# Patient Record
Sex: Female | Born: 1970
Health system: Southern US, Community
[De-identification: ages and names within clinical notes are randomized; demographics above are authoritative.]

---

## 2014-10-03 ENCOUNTER — Other Ambulatory Visit (HOSPITAL_COMMUNITY)
Admission: RE | Admit: 2014-10-03 | Discharge: 2014-10-03 | Disposition: A | Discharge status: Home / Self Care | Payer: 59 | Source: Ambulatory Visit | Attending: Family Medicine | Admitting: Family Medicine

## 2014-10-03 ENCOUNTER — Other Ambulatory Visit: Payer: Self-pay | Admitting: Family Medicine

## 2014-10-03 DIAGNOSIS — Z1231 Encounter for screening mammogram for malignant neoplasm of breast: Secondary | ICD-10-CM

## 2014-10-03 DIAGNOSIS — Z01419 Encounter for gynecological examination (general) (routine) without abnormal findings: Secondary | ICD-10-CM | POA: Insufficient documentation

## 2014-10-07 LAB — CYTOLOGY - PAP

## 2015-11-28 DIAGNOSIS — H5203 Hypermetropia, bilateral: Secondary | ICD-10-CM | POA: Diagnosis not present

## 2015-11-28 DIAGNOSIS — H04123 Dry eye syndrome of bilateral lacrimal glands: Secondary | ICD-10-CM | POA: Diagnosis not present

## 2015-11-28 DIAGNOSIS — H524 Presbyopia: Secondary | ICD-10-CM | POA: Diagnosis not present

## 2015-12-12 ENCOUNTER — Ambulatory Visit
Admission: RE | Admit: 2015-12-12 | Discharge: 2015-12-12 | Disposition: A | Discharge status: Home / Self Care | Payer: 59 | Source: Ambulatory Visit | Attending: Family Medicine | Admitting: Family Medicine

## 2015-12-12 ENCOUNTER — Other Ambulatory Visit: Payer: Self-pay | Admitting: Family Medicine

## 2015-12-12 DIAGNOSIS — M19049 Primary osteoarthritis, unspecified hand: Secondary | ICD-10-CM | POA: Diagnosis not present

## 2015-12-12 DIAGNOSIS — M79641 Pain in right hand: Secondary | ICD-10-CM

## 2015-12-12 DIAGNOSIS — M79642 Pain in left hand: Secondary | ICD-10-CM

## 2015-12-12 DIAGNOSIS — M19041 Primary osteoarthritis, right hand: Secondary | ICD-10-CM | POA: Diagnosis not present

## 2015-12-26 DIAGNOSIS — M25532 Pain in left wrist: Secondary | ICD-10-CM | POA: Diagnosis not present

## 2015-12-26 DIAGNOSIS — S63502A Unspecified sprain of left wrist, initial encounter: Secondary | ICD-10-CM | POA: Diagnosis not present

## 2016-01-08 DIAGNOSIS — M79642 Pain in left hand: Secondary | ICD-10-CM | POA: Diagnosis not present

## 2016-01-08 DIAGNOSIS — M2550 Pain in unspecified joint: Secondary | ICD-10-CM | POA: Diagnosis not present

## 2016-01-08 DIAGNOSIS — M79641 Pain in right hand: Secondary | ICD-10-CM | POA: Diagnosis not present

## 2016-01-09 DIAGNOSIS — S63502D Unspecified sprain of left wrist, subsequent encounter: Secondary | ICD-10-CM | POA: Diagnosis not present

## 2016-09-01 DIAGNOSIS — H5201 Hypermetropia, right eye: Secondary | ICD-10-CM | POA: Diagnosis not present

## 2017-01-06 DIAGNOSIS — H5203 Hypermetropia, bilateral: Secondary | ICD-10-CM | POA: Diagnosis not present

## 2018-01-09 DIAGNOSIS — H1033 Unspecified acute conjunctivitis, bilateral: Secondary | ICD-10-CM | POA: Diagnosis not present

## 2018-01-12 ENCOUNTER — Telehealth: Payer: Self-pay | Admitting: General Practice

## 2018-01-12 NOTE — Telephone Encounter (Signed)
Patient has set a [facility] request to become a patient of yours.   Please advise. Thank you.

## 2018-01-13 NOTE — Telephone Encounter (Signed)
Fine

## 2018-01-13 NOTE — Telephone Encounter (Signed)
LVM to inform patient Dr.Crawford would accept her as a New patient, to please call the office back and set that appointment up.   Office type would be OV and office note would be NEW PATIENT - We will have manger changer office type at a later date.

## 2018-01-16 NOTE — Telephone Encounter (Signed)
LVM again information patient to call and set up an appointment.

## 2018-02-17 DIAGNOSIS — H524 Presbyopia: Secondary | ICD-10-CM | POA: Diagnosis not present

## 2018-02-17 DIAGNOSIS — H5203 Hypermetropia, bilateral: Secondary | ICD-10-CM | POA: Diagnosis not present

## 2018-07-26 DIAGNOSIS — K1379 Other lesions of oral mucosa: Secondary | ICD-10-CM | POA: Diagnosis not present

## 2018-07-26 DIAGNOSIS — H9202 Otalgia, left ear: Secondary | ICD-10-CM | POA: Diagnosis not present

## 2018-07-26 MED FILL — AMOX-CLAV 875-125 MG TABLET: 875-125 | 7 days supply | Qty: 14 | Fill #0

## 2018-07-26 MED FILL — CHLORHEXIDINE 0.12% RINSE: 0.12 | 15 days supply | Qty: 473 | Fill #0

## 2018-07-26 MED FILL — MELOXICAM 15 MG TABLET: 15 | 90 days supply | Qty: 90 | Fill #0

## 2018-07-26 MED FILL — predniSONE 10 MG (21) TBPK: 10 | 6 days supply | Qty: 21 | Fill #0

## 2018-08-08 DIAGNOSIS — R5383 Other fatigue: Secondary | ICD-10-CM | POA: Diagnosis not present

## 2018-08-08 DIAGNOSIS — R0602 Shortness of breath: Secondary | ICD-10-CM | POA: Diagnosis not present

## 2018-08-08 DIAGNOSIS — Z01419 Encounter for gynecological examination (general) (routine) without abnormal findings: Secondary | ICD-10-CM | POA: Diagnosis not present

## 2018-08-08 DIAGNOSIS — Z114 Encounter for screening for human immunodeficiency virus [HIV]: Secondary | ICD-10-CM | POA: Diagnosis not present

## 2018-08-08 DIAGNOSIS — Z0000 Encounter for general adult medical examination without abnormal findings: Secondary | ICD-10-CM | POA: Diagnosis not present

## 2018-08-08 DIAGNOSIS — E559 Vitamin D deficiency, unspecified: Secondary | ICD-10-CM | POA: Diagnosis not present

## 2018-08-08 LAB — PULMONARY FUNCTION TEST
FEV1/FVC: 48.6 %
FEV1/FVC: 48.6 %
FEV1: 1.5 L
FEV1: 1.5 L
FVC: 3 L
FVC: 3 L

## 2018-09-11 DIAGNOSIS — R87810 Cervical high risk human papillomavirus (HPV) DNA test positive: Secondary | ICD-10-CM | POA: Diagnosis not present

## 2019-04-16 DIAGNOSIS — H5203 Hypermetropia, bilateral: Secondary | ICD-10-CM | POA: Diagnosis not present

## 2019-04-16 DIAGNOSIS — H04123 Dry eye syndrome of bilateral lacrimal glands: Secondary | ICD-10-CM | POA: Diagnosis not present

## 2019-04-16 DIAGNOSIS — H524 Presbyopia: Secondary | ICD-10-CM | POA: Diagnosis not present

## 2020-01-07 ENCOUNTER — Ambulatory Visit: Payer: Medicaid Other | Admitting: Family Medicine

## 2020-10-01 ENCOUNTER — Other Ambulatory Visit: Payer: Self-pay | Admitting: Adult Medicine

## 2020-10-01 DIAGNOSIS — Z0000 Encounter for general adult medical examination without abnormal findings: Secondary | ICD-10-CM

## 2020-10-09 ENCOUNTER — Telehealth: Payer: Self-pay | Admitting: Hematology and Oncology

## 2020-10-09 NOTE — Telephone Encounter (Signed)
Received a new hem referral from [facility] for neutropenia. Ms. Backer returned my call and has been scheduled to see Dr. Leonides Schanz on  2/9 at 1pm. Pt aware to arrive 20 minutes early.

## 2020-10-13 LAB — HM COLONOSCOPY

## 2020-10-15 ENCOUNTER — Encounter: Payer: Self-pay | Admitting: Hematology and Oncology

## 2020-10-15 ENCOUNTER — Inpatient Hospital Stay: Payer: Medicaid Other

## 2020-10-15 ENCOUNTER — Other Ambulatory Visit: Payer: Self-pay

## 2020-10-15 ENCOUNTER — Inpatient Hospital Stay: Payer: Medicaid Other | Attending: Hematology and Oncology | Admitting: Hematology and Oncology

## 2020-10-15 VITALS — BP 137/88 | HR 82 | Temp 97.0°F | Resp 16 | Ht 65.0 in | Wt 151.1 lb

## 2020-10-15 DIAGNOSIS — D700 Congenital agranulocytosis: Secondary | ICD-10-CM

## 2020-10-15 DIAGNOSIS — D72819 Decreased white blood cell count, unspecified: Secondary | ICD-10-CM | POA: Diagnosis present

## 2020-10-15 DIAGNOSIS — D709 Neutropenia, unspecified: Secondary | ICD-10-CM | POA: Insufficient documentation

## 2020-10-15 LAB — CBC WITH DIFFERENTIAL (CANCER CENTER ONLY)
Abs Immature Granulocytes: 0 10*3/uL (ref 0.00–0.07)
Basophils Absolute: 0 10*3/uL (ref 0.0–0.1)
Basophils Relative: 1 %
Eosinophils Absolute: 0.1 10*3/uL (ref 0.0–0.5)
Eosinophils Relative: 3 %
HCT: 34.7 % — ABNORMAL LOW (ref 36.0–46.0)
Hemoglobin: 11 g/dL — ABNORMAL LOW (ref 12.0–15.0)
Immature Granulocytes: 0 %
Lymphocytes Relative: 46 %
Lymphs Abs: 1.3 10*3/uL (ref 0.7–4.0)
MCH: 26.4 pg (ref 26.0–34.0)
MCHC: 31.7 g/dL (ref 30.0–36.0)
MCV: 83.4 fL (ref 80.0–100.0)
Monocytes Absolute: 0.3 10*3/uL (ref 0.1–1.0)
Monocytes Relative: 12 %
Neutro Abs: 1.1 10*3/uL — ABNORMAL LOW (ref 1.7–7.7)
Neutrophils Relative %: 38 %
Platelet Count: 144 10*3/uL — ABNORMAL LOW (ref 150–400)
RBC: 4.16 MIL/uL (ref 3.87–5.11)
RDW: 12.9 % (ref 11.5–15.5)
WBC Count: 2.8 10*3/uL — ABNORMAL LOW (ref 4.0–10.5)
nRBC: 0 % (ref 0.0–0.2)

## 2020-10-15 LAB — CMP (CANCER CENTER ONLY)
ALT: 21 U/L (ref 0–44)
AST: 23 U/L (ref 15–41)
Albumin: 4.3 g/dL (ref 3.5–5.0)
Alkaline Phosphatase: 48 U/L (ref 38–126)
Anion gap: 7 (ref 5–15)
BUN: 12 mg/dL (ref 6–20)
CO2: 28 mmol/L (ref 22–32)
Calcium: 9.6 mg/dL (ref 8.9–10.3)
Chloride: 109 mmol/L (ref 98–111)
Creatinine: 0.85 mg/dL (ref 0.44–1.00)
GFR, Estimated: >60 mL/min (ref >60)
Glucose, Bld: 101 mg/dL — ABNORMAL HIGH (ref 70–99)
Potassium: 3.9 mmol/L (ref 3.5–5.1)
Sodium: 144 mmol/L (ref 135–145)
Total Bilirubin: 0.4 mg/dL (ref 0.3–1.2)
Total Protein: 7.6 g/dL (ref 6.5–8.1)

## 2020-10-15 LAB — SEDIMENTATION RATE: Sed Rate: 23 mm/hr — ABNORMAL HIGH (ref 0–22)

## 2020-10-15 LAB — VITAMIN B12: Vitamin B-12: 537 pg/mL (ref 180–914)

## 2020-10-15 LAB — HEPATITIS B CORE ANTIBODY, TOTAL: Hep B Core Total Ab: NONREACTIVE

## 2020-10-15 LAB — HEPATITIS C ANTIBODY: HCV Ab: NONREACTIVE

## 2020-10-15 LAB — HIV ANTIBODY (ROUTINE TESTING W REFLEX): HIV Screen 4th Generation wRfx: NONREACTIVE

## 2020-10-15 LAB — HEPATITIS B SURFACE ANTIGEN: Hepatitis B Surface Ag: NONREACTIVE

## 2020-10-15 LAB — FOLATE: Folate: 17.5 ng/mL (ref >5.9)

## 2020-10-15 LAB — SAVE SMEAR(SSMR), FOR PROVIDER SLIDE REVIEW

## 2020-10-15 NOTE — Progress Notes (Signed)
[place] Telephone:([phone]   [phone]  INITIAL CONSULT NOTE  Patient Care Team: Patient, No Pcp Per as PCP - General (General Practice)  Hematological/Oncological History # Leukopenia/Neutropenia 08/22/2020: WBC 3.5, Hgb 11.7, MCV 83, Plt 149. ANC 1.2, ALC 1.7 10/15/2020: establish care with Dr. Lorenso Courier   CHIEF COMPLAINTS/PURPOSE OF CONSULTATION:  "Neutropenia "  HISTORY OF PRESENTING ILLNESS:  Carolyn Henry 50 y.o. female with no remarkable medical history who presents for evaluation of leukopenia.   On review of the previous records Carolyn Henry had a CBC drawn on 08/22/2020 which showed a white blood cell count 3.5, hemoglobin 9.7, MCV of 83, platelets of 149, ANC of 1.2, with an ALC of 1.7.  There was noted to be a relative lymphocytosis given the moderately low ANC.  Due to concern for the patient's leukopenia the patient was referred to hematology for further evaluation and management.  On exam today Carolyn Henry notes that she has been aware of her low white blood cell count for less than a year.  She notes that she did not regularly see a primary care provider until recently when she established with his new provider.  She has never been told that she had trouble with her blood before.  She has never had any issues with anemia.  She currently denies any issues with recurrent infection such as urinary tract infections, diarrhea, or sinus infections.  She denies any fevers, chills, sweats, nausea, vomiting or diarrhea.  On further discussion the patient notes that she immigrated to [place] in the year 2000 from [place].  She moved [place] in 2020.  She reports that she has an unrestricted diet and eats everything including beef, chicken, pork, fish.  She used to take a multivitamin, but now only takes vitamin D.  Her family history is remarkable only for glaucoma in her father but no other health problems in the family other than  seizures in her son.  She is a never smoker and does not drink.  She had no additional questions comments or concerns today.  A full 10 point ROS is listed below.  MEDICAL HISTORY:  History reviewed. No pertinent past medical history.  SURGICAL HISTORY: Past Surgical History:  Procedure Laterality Date  . CESAREAN SECTION  2004    SOCIAL HISTORY: Social History   Socioeconomic History  . Marital status: Married    Spouse name: Not on file  . Number of children: 2  . Years of education: Not on file  . Highest education level: Not on file  Occupational History  . Not on file  Tobacco Use  . Smoking status: Never Smoker  . Smokeless tobacco: Never Used  Vaping Use  . Vaping Use: Never used  Substance and Sexual Activity  . Alcohol use: Never  . Drug use: Never  . Sexual activity: Not on file  Other Topics Concern  . Not on file  Social History Narrative  . Not on file   Social Determinants of Health   Financial Resource Strain: Not on file  Food Insecurity: Not on file  Transportation Needs: Not on file  Physical Activity: Not on file  Stress: Not on file  Social Connections: Not on file  Intimate Partner Violence: Not on file    FAMILY HISTORY: Family History  Problem Relation Age of Onset  . Healthy Mother   . Glaucoma Father   . Healthy Sister   . Healthy Brother     ALLERGIES:  has  no allergies on file.  MEDICATIONS:  Current Outpatient Medications  Medication Sig Dispense Refill  . VITAMIN D, ERGOCALCIFEROL, PO Take 500 mg PE by mouth daily.     No current facility-administered medications for this visit.    REVIEW OF SYSTEMS:   Constitutional: ( - ) fevers, ( - )  chills , ( - ) night sweats Eyes: ( - ) blurriness of vision, ( - ) double vision, ( - ) watery eyes Ears, nose, mouth, throat, and face: ( - ) mucositis, ( - ) sore throat Respiratory: ( - ) cough, ( - ) dyspnea, ( - ) wheezes Cardiovascular: ( - ) palpitation, ( - ) chest  discomfort, ( - ) lower extremity swelling Gastrointestinal:  ( - ) nausea, ( - ) heartburn, ( - ) change in bowel habits Skin: ( - ) abnormal skin rashes Lymphatics: ( - ) new lymphadenopathy, ( - ) easy bruising Neurological: ( - ) numbness, ( - ) tingling, ( - ) new weaknesses Behavioral/Psych: ( - ) mood change, ( - ) new changes  All other systems were reviewed with the patient and are negative.  PHYSICAL EXAMINATION:  Vitals:   10/15/20 1300  BP: 137/88  Pulse: 82  Resp: 16  Temp: (!) 97 F (36.1 C)  SpO2: 100%   Filed Weights   10/15/20 1300  Weight: 151 lb 1.6 oz (68.5 kg)    GENERAL: well appearing middle aged [place] American female in NAD  SKIN: skin color, texture, turgor are normal, no rashes or significant lesions EYES: conjunctiva are pink and non-injected, sclera clear LUNGS: clear to auscultation and percussion with normal breathing effort HEART: regular rate & rhythm and no murmurs and no lower extremity edema Musculoskeletal: no cyanosis of digits and no clubbing  PSYCH: alert & oriented x 3, fluent speech NEURO: no focal motor/sensory deficits  LABORATORY DATA:  I have reviewed the data as listed No flowsheet data found.  No flowsheet data found.   RADIOGRAPHIC STUDIES: No results found.  ASSESSMENT & PLAN Carolyn Henry 50 y.o. female with no remarkable medical history who presents for evaluation of leukopenia.   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  This is a common condition whereby individuals of African or Mediterranean descent tend to have slightly lower white blood cell counts in the general population.  Hallmark of the syndrome is an Early between 1000 and 1500 which is patient falls into.  This is of little to no clinical consequence.  Unfortunate this is a diagnosis of exclusion and therefore we will do further work-up in order to assure there  is no other etiology.  The differential for neutropenia is benign ethnic neutropenia, infectious etiology, nutritional etiology, inflammatory etiology, or medication.  The patient is not currently taking any medications know to cause neutropenia.  She also does not have any other signs or symptoms of nutritional deficiency, however we will check nutritional levels.  We will order viral studies with hep B, hep C, and HIV as well.  Assuming none of the studies show any concerning abnormalities we will see the patient back in 6 months time in order to continue to monitor.  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 --Rule out infectious etiology with hepatitis B serologies, hepatitis C serologies, and HIV. --Nutritional evaluation with vitamin B12 and folate. --Repeat CBC and CMP.  Additionally  we will order ESR and a peripheral blood film. --Assuming there are no concerning findings on her blood work-up today we will plan to see the patient back in approximately 6 months time.  Orders Placed This Encounter  Procedures  . CBC with Differential (Cancer Center Only)    Standing Status:   Future    Number of Occurrences:   1    Standing Expiration Date:   10/15/2021  . Save Smear (SSMR)    Standing Status:   Future    Number of Occurrences:   1    Standing Expiration Date:   10/15/2021  . CMP ([place] only)    Standing Status:   Future    Number of Occurrences:   1    Standing Expiration Date:   10/15/2021  . Vitamin B12    Standing Status:   Future    Number of Occurrences:   1    Standing Expiration Date:   10/15/2021  . Folate, Serum    Standing Status:   Future    Number of Occurrences:   1    Standing Expiration Date:   10/15/2021  . Sedimentation rate    Standing Status:   Future    Number of Occurrences:   1    Standing Expiration Date:   10/15/2021  . Hepatitis B surface antigen    Standing  Status:   Future    Number of Occurrences:   1    Standing Expiration Date:   10/15/2021  . Hepatitis B core antibody, total    Standing Status:   Future    Number of Occurrences:   1    Standing Expiration Date:   10/15/2021  . Hepatitis C antibody    Standing Status:   Future    Number of Occurrences:   1    Standing Expiration Date:   10/15/2021  . HIV antibody (with reflex)    Standing Status:   Future    Number of Occurrences:   1    Standing Expiration Date:   10/15/2021  . TSH    Standing Status:   Future    Standing Expiration Date:   10/15/2021    All questions were answered. The patient knows to call the clinic with any problems, questions or concerns.  A total of more than 60 minutes were spent on this encounter and over half of that time was spent on counseling and coordination of care as outlined above.   Ledell Peoples, MD Department of Hematology/Oncology [place] [facility] Phone: [phone] Pager: [phone] Email: [email or web address]  10/15/2020 2:09 PM

## 2020-11-03 ENCOUNTER — Telehealth: Payer: Self-pay | Admitting: *Deleted

## 2020-11-03 NOTE — Telephone Encounter (Signed)
Received call from patient regarding her recent lab results.  Attempted to call patient. No answer but was able to leave vm message for her to return call on 11/05/20. Her labs are within normal limits.  Appears to be benign ethnic neutropenia  As her labs are normal per Dr. Leonides Schanz

## 2020-11-05 NOTE — Telephone Encounter (Signed)
Received call back from patient. Advised that her labs are normal and likely her lower neutrophils are due to benign ethnic neutropenia-related to her ethnic background. She voiced understanding. No other questions or concerns.

## 2020-11-14 ENCOUNTER — Ambulatory Visit
Admission: RE | Admit: 2020-11-14 | Discharge: 2020-11-14 | Disposition: A | Discharge status: Home / Self Care | Payer: Medicaid Other | Source: Ambulatory Visit | Attending: Adult Medicine | Admitting: Adult Medicine

## 2020-11-14 ENCOUNTER — Other Ambulatory Visit: Payer: Self-pay

## 2020-11-14 DIAGNOSIS — Z0000 Encounter for general adult medical examination without abnormal findings: Secondary | ICD-10-CM

## 2020-12-10 ENCOUNTER — Telehealth: Payer: Self-pay | Admitting: Hematology and Oncology

## 2020-12-10 NOTE — Telephone Encounter (Signed)
Scheduled appts per 4/5 sch msg. Pt aware.  

## 2020-12-11 ENCOUNTER — Telehealth: Payer: Self-pay | Admitting: *Deleted

## 2020-12-11 NOTE — Telephone Encounter (Signed)
Received request from scheduler to call patient regarding her appts.   TCT patient and spoke with her. Clarified upcoming appts and advised that she  Needs to only come in 56 months for labs and to see Dr. Leonides Schanz.  Advised that she does not need any labs done prior to that August appt. Pt voiced understanding. Pt requested that her appt in august be moved to a little later in the day.  Scheduling message sent

## 2020-12-16 ENCOUNTER — Telehealth: Payer: Self-pay | Admitting: Hematology and Oncology

## 2020-12-16 NOTE — Telephone Encounter (Signed)
R/s appt per 4/7 sch msg. Called pt, no answer. Left msg with updated appt time.

## 2020-12-23 ENCOUNTER — Inpatient Hospital Stay: Payer: Medicaid Other

## 2021-01-19 ENCOUNTER — Ambulatory Visit: Payer: Medicaid Other | Admitting: Hematology and Oncology

## 2021-01-21 ENCOUNTER — Ambulatory Visit: Payer: Medicaid Other | Admitting: Hematology and Oncology

## 2021-03-19 ENCOUNTER — Ambulatory Visit (HOSPITAL_COMMUNITY)
Admission: RE | Admit: 2021-03-19 | Discharge: 2021-03-19 | Disposition: A | Discharge status: Home / Self Care | Payer: Self-pay | Source: Ambulatory Visit | Attending: Occupational Medicine | Admitting: Occupational Medicine

## 2021-03-19 ENCOUNTER — Other Ambulatory Visit: Payer: Self-pay

## 2021-03-19 ENCOUNTER — Other Ambulatory Visit (HOSPITAL_COMMUNITY): Payer: Self-pay | Admitting: Occupational Medicine

## 2021-03-19 DIAGNOSIS — R7611 Nonspecific reaction to tuberculin skin test without active tuberculosis: Secondary | ICD-10-CM

## 2021-04-13 DIAGNOSIS — Z111 Encounter for screening for respiratory tuberculosis: Secondary | ICD-10-CM | POA: Diagnosis not present

## 2021-04-13 DIAGNOSIS — R7612 Nonspecific reaction to cell mediated immunity measurement of gamma interferon antigen response without active tuberculosis: Secondary | ICD-10-CM | POA: Diagnosis not present

## 2021-04-15 ENCOUNTER — Other Ambulatory Visit: Payer: Medicaid Other

## 2021-04-15 ENCOUNTER — Ambulatory Visit: Payer: Medicaid Other | Admitting: Hematology and Oncology

## 2021-04-15 ENCOUNTER — Inpatient Hospital Stay: Payer: Medicaid Other | Admitting: Hematology and Oncology

## 2021-04-15 ENCOUNTER — Other Ambulatory Visit: Payer: Self-pay | Admitting: Hematology and Oncology

## 2021-04-15 ENCOUNTER — Inpatient Hospital Stay: Payer: Medicaid Other

## 2021-04-15 DIAGNOSIS — D696 Thrombocytopenia, unspecified: Secondary | ICD-10-CM

## 2021-04-15 DIAGNOSIS — D700 Congenital agranulocytosis: Secondary | ICD-10-CM

## 2021-05-14 DIAGNOSIS — H524 Presbyopia: Secondary | ICD-10-CM | POA: Diagnosis not present

## 2021-09-23 ENCOUNTER — Other Ambulatory Visit: Payer: Self-pay

## 2021-09-23 ENCOUNTER — Ambulatory Visit (INDEPENDENT_AMBULATORY_CARE_PROVIDER_SITE_OTHER): Payer: 59 | Admitting: Internal Medicine

## 2021-09-23 ENCOUNTER — Encounter: Payer: Self-pay | Admitting: Internal Medicine

## 2021-09-23 VITALS — BP 120/82 | HR 74 | Temp 99.0°F | Ht 65.0 in | Wt 149.8 lb

## 2021-09-23 DIAGNOSIS — D539 Nutritional anemia, unspecified: Secondary | ICD-10-CM | POA: Diagnosis not present

## 2021-09-23 DIAGNOSIS — D61818 Other pancytopenia: Secondary | ICD-10-CM

## 2021-09-23 DIAGNOSIS — Z0001 Encounter for general adult medical examination with abnormal findings: Secondary | ICD-10-CM

## 2021-09-23 DIAGNOSIS — Z1231 Encounter for screening mammogram for malignant neoplasm of breast: Secondary | ICD-10-CM

## 2021-09-23 DIAGNOSIS — R87619 Unspecified abnormal cytological findings in specimens from cervix uteri: Secondary | ICD-10-CM | POA: Insufficient documentation

## 2021-09-23 DIAGNOSIS — Z124 Encounter for screening for malignant neoplasm of cervix: Secondary | ICD-10-CM

## 2021-09-23 HISTORY — DX: Nutritional anemia, unspecified: D53.9

## 2021-09-23 LAB — BASIC METABOLIC PANEL
BUN: 11 mg/dL (ref 6–23)
CO2: 29 mEq/L (ref 19–32)
Calcium: 9.9 mg/dL (ref 8.4–10.5)
Chloride: 105 mEq/L (ref 96–112)
Creatinine, Ser: 0.8 mg/dL (ref 0.40–1.20)
GFR: 85.3 mL/min (ref >60.00)
Glucose, Bld: 88 mg/dL (ref 70–99)
Potassium: 4.2 mEq/L (ref 3.5–5.1)
Sodium: 141 mEq/L (ref 135–145)

## 2021-09-23 LAB — CBC WITH DIFFERENTIAL/PLATELET
Basophils Absolute: 0 10*3/uL (ref 0.0–0.1)
Basophils Relative: 0.4 % (ref 0.0–3.0)
Eosinophils Absolute: 0.1 10*3/uL (ref 0.0–0.7)
Eosinophils Relative: 5.1 % — ABNORMAL HIGH (ref 0.0–5.0)
HCT: 34.8 % — ABNORMAL LOW (ref 36.0–46.0)
Hemoglobin: 11.4 g/dL — ABNORMAL LOW (ref 12.0–15.0)
Lymphocytes Relative: 50.1 % — ABNORMAL HIGH (ref 12.0–46.0)
Lymphs Abs: 1.5 10*3/uL (ref 0.7–4.0)
MCHC: 32.9 g/dL (ref 30.0–36.0)
MCV: 80.6 fl (ref 78.0–100.0)
Monocytes Absolute: 0.4 10*3/uL (ref 0.1–1.0)
Monocytes Relative: 12.7 % — ABNORMAL HIGH (ref 3.0–12.0)
Neutro Abs: 0.9 10*3/uL — ABNORMAL LOW (ref 1.4–7.7)
Neutrophils Relative %: 31.7 % — ABNORMAL LOW (ref 43.0–77.0)
Platelets: 128 10*3/uL — ABNORMAL LOW (ref 150.0–400.0)
RBC: 4.31 Mil/uL (ref 3.87–5.11)
RDW: 13.2 % (ref 11.5–15.5)
WBC: 2.9 10*3/uL — ABNORMAL LOW (ref 4.0–10.5)

## 2021-09-23 LAB — HEPATIC FUNCTION PANEL
ALT: 23 U/L (ref 0–35)
AST: 33 U/L (ref 0–37)
Albumin: 4.5 g/dL (ref 3.5–5.2)
Alkaline Phosphatase: 41 U/L (ref 39–117)
Bilirubin, Direct: 0.1 mg/dL (ref 0.0–0.3)
Total Bilirubin: 0.6 mg/dL (ref 0.2–1.2)
Total Protein: 7.7 g/dL (ref 6.0–8.3)

## 2021-09-23 LAB — IBC + FERRITIN
Ferritin: 122.8 ng/mL (ref 10.0–291.0)
Iron: 82 ug/dL (ref 42–145)
Saturation Ratios: 27.5 % (ref 20.0–50.0)
TIBC: 298.2 ug/dL (ref 250.0–450.0)
Transferrin: 213 mg/dL (ref 212.0–360.0)

## 2021-09-23 LAB — TSH: TSH: 2.45 u[IU]/mL (ref 0.35–5.50)

## 2021-09-23 LAB — LIPID PANEL
Cholesterol: 240 mg/dL — ABNORMAL HIGH (ref 0–200)
HDL: 82.2 mg/dL (ref >39.00)
LDL Cholesterol: 148 mg/dL — ABNORMAL HIGH (ref 0–99)
NonHDL: 158.17
Total CHOL/HDL Ratio: 3
Triglycerides: 53 mg/dL (ref 0.0–149.0)
VLDL: 10.6 mg/dL (ref 0.0–40.0)

## 2021-09-23 LAB — FOLATE: Folate: >24.2 ng/mL (ref >5.9)

## 2021-09-23 LAB — VITAMIN B12: Vitamin B-12: 532 pg/mL (ref 211–911)

## 2021-09-23 NOTE — Progress Notes (Signed)
Subjective:  Patient ID: Carolyn Henry, female    DOB: April 06, 1971  Age: 51 y.o. MRN: [id number]  CC: Annual Exam and Anemia  This visit occurred during the SARS-CoV-2 public health emergency.  Safety protocols were in place, including screening questions prior to the visit, additional usage of staff PPE, and extensive cleaning of exam room while observing appropriate contact time as indicated for disinfecting solutions.    HPI Carolyn Henry presents for a CPX and to establish.  She has a history of pancytopenia.  She denies fever, chills, night sweats, shortness of breath, lymphadenopathy, bleeding, or bruising.  History Carolyn Henry has a past medical history of Deficiency anemia (09/23/2021).   She has a past surgical history that includes Cesarean section (2004).   Her family history includes Glaucoma in her father; Healthy in her brother, mother, and sister.She reports that she has never smoked. She has never used smokeless tobacco. She reports that she does not drink alcohol and does not use drugs.  Outpatient Medications Prior to Visit  Medication Sig Dispense Refill   VITAMIN D, ERGOCALCIFEROL, PO Take 500 mg PE by mouth daily.     No facility-administered medications prior to visit.    ROS Review of Systems  Constitutional:  Negative for chills, diaphoresis, fatigue and fever.  HENT: Negative.    Eyes: Negative.   Respiratory:  Negative for cough, chest tightness, shortness of breath and wheezing.   Cardiovascular:  Negative for chest pain, palpitations and leg swelling.  Gastrointestinal:  Negative for abdominal pain, constipation, diarrhea, nausea and vomiting.  Endocrine: Negative.   Genitourinary: Negative.  Negative for difficulty urinating and dysuria.  Musculoskeletal: Negative.  Negative for arthralgias.  Skin: Negative.   Neurological:  Negative for dizziness, weakness and light-headedness.  Hematological:  Negative for adenopathy. Does not  bruise/bleed easily.  Psychiatric/Behavioral: Negative.     Objective:  BP 120/82 (BP Location: Left Arm, Patient Position: Sitting, Cuff Size: Normal)    Pulse 74    Temp 99 F (37.2 C) (Oral)    Ht 5\' 5"  (1.651 m)    Wt 149 lb 12.8 oz (67.9 kg)    LMP 02/09/2012 (Approximate)    SpO2 98%    BMI 24.93 kg/m   Physical Exam Vitals reviewed.  HENT:     Nose: Nose normal.     Mouth/Throat:     Mouth: Mucous membranes are moist. Mucous membranes are pale.  Eyes:     General: No scleral icterus.    Conjunctiva/sclera: Conjunctivae normal.  Cardiovascular:     Rate and Rhythm: Normal rate and regular rhythm.     Heart sounds: No murmur heard. Pulmonary:     Effort: Pulmonary effort is normal.     Breath sounds: No stridor. No wheezing, rhonchi or rales.  Abdominal:     General: Abdomen is flat.     Palpations: There is no mass.     Tenderness: There is no abdominal tenderness. There is no guarding.     Hernia: No hernia is present.  Musculoskeletal:     Cervical back: Neck supple.  Lymphadenopathy:     Cervical: No cervical adenopathy.  Skin:    General: Skin is warm and dry.     Coloration: Skin is pale.  Neurological:     General: No focal deficit present.     Mental Status: She is alert.  Psychiatric:        Mood and Affect: Mood normal.    Lab Results  Component Value Date   WBC 2.9 (L) 09/23/2021   HGB 11.3 (L) 09/23/2021   HGB 11.4 (L) 09/23/2021   HCT 35.3 09/23/2021   HCT 34.8 (L) 09/23/2021   PLT 128.0 (L) 09/23/2021   GLUCOSE 88 09/23/2021   CHOL 240 (H) 09/23/2021   TRIG 53.0 09/23/2021   HDL 82.20 09/23/2021   LDLCALC 148 (H) 09/23/2021   ALT 23 09/23/2021   AST 33 09/23/2021   NA 141 09/23/2021   K 4.2 09/23/2021   CL 105 09/23/2021   CREATININE 0.80 09/23/2021   BUN 11 09/23/2021   CO2 29 09/23/2021   TSH 2.45 09/23/2021      Assessment & Plan:   Carolyn Henry was seen today for annual exam and anemia.  Diagnoses and all orders for this  visit:  Deficiency anemia- Vitamin levels are normal and her hemoglobin phenotype is normal.  I have asked that she follow-up with heme-onc regarding pancytopenia. -     Vitamin B12; Future -     CBC with Differential/Platelet; Future -     IBC + Ferritin; Future -     Cancel: Lipid panel; Future -     Reticulocytes; Future -     Hemoglobinopathy Evaluation; Future -     Zinc; Future -     Vitamin B1; Future -     TSH; Future -     Folate; Future -     Basic metabolic panel; Future -     Hepatic function panel; Future -     Hepatic function panel -     Basic metabolic panel -     Folate -     TSH -     Vitamin B1 -     Zinc -     Hemoglobinopathy Evaluation -     Reticulocytes -     IBC + Ferritin -     CBC with Differential/Platelet -     Vitamin B12  Encounter for general adult medical examination with abnormal findings- Exam completed, labs reviewed:statin therapy is not indicated, she will have vaccine records forwarded to me from [place] and she also thinks that she has undergone colon cancer screening [facility], other cancer screenings addressed, patient education was given. -     Lipid panel; Future -     Lipid panel  Cervical cancer screening -     Ambulatory referral to Gynecology  Pancytopenia [facility]) -     Ambulatory referral to Hematology / Oncology   I am having Carolyn Pounds. Villalva "Margarette" maintain her (VITAMIN D, ERGOCALCIFEROL, PO).  No orders of the defined types were placed in this encounter.    Follow-up: Return in about 3 months (around 12/22/2021).  Sanda Linger, MD

## 2021-09-23 NOTE — Patient Instructions (Signed)

## 2021-09-27 ENCOUNTER — Encounter: Payer: Self-pay | Admitting: Internal Medicine

## 2021-09-27 DIAGNOSIS — D61818 Other pancytopenia: Secondary | ICD-10-CM | POA: Insufficient documentation

## 2021-09-27 LAB — ZINC: Zinc: 124 ug/dL (ref 60–130)

## 2021-09-27 LAB — RETICULOCYTES
ABS Retic: 37440 cells/uL (ref 20000–80000)
Retic Ct Pct: 0.9 %

## 2021-09-27 LAB — VITAMIN B1: Vitamin B1 (Thiamine): 14 nmol/L (ref 8–30)

## 2021-09-27 LAB — HEMOGLOBINOPATHY EVALUATION
Fetal Hemoglobin Testing: 1.4 % (ref 0.0–1.9)
HCT: 35.3 % (ref 35.0–45.0)
Hemoglobin A2 - HGBRFX: 2.6 % (ref 2.2–3.2)
Hemoglobin: 11.3 g/dL — ABNORMAL LOW (ref 11.7–15.5)
Hgb A: 96 % — ABNORMAL LOW (ref >96.0)
MCH: 27 pg (ref 27.0–33.0)
MCV: 84.4 fL (ref 80.0–100.0)
RBC: 4.18 10*6/uL (ref 3.80–5.10)
RDW: 13 % (ref 11.0–15.0)

## 2021-09-28 ENCOUNTER — Telehealth: Payer: Self-pay | Admitting: Hematology and Oncology

## 2021-09-28 NOTE — Telephone Encounter (Signed)
Scheduled appt per 1/23 referral. Spoke to pt who is aware of appt date and time.  °

## 2021-09-30 ENCOUNTER — Other Ambulatory Visit: Payer: Self-pay

## 2021-09-30 ENCOUNTER — Inpatient Hospital Stay: Payer: 59 | Attending: Hematology and Oncology | Admitting: Hematology and Oncology

## 2021-09-30 VITALS — BP 121/64 | HR 63 | Temp 97.0°F | Resp 18 | Wt 148.2 lb

## 2021-09-30 DIAGNOSIS — D700 Congenital agranulocytosis: Secondary | ICD-10-CM

## 2021-09-30 DIAGNOSIS — D696 Thrombocytopenia, unspecified: Secondary | ICD-10-CM | POA: Diagnosis not present

## 2021-09-30 DIAGNOSIS — Z201 Contact with and (suspected) exposure to tuberculosis: Secondary | ICD-10-CM | POA: Insufficient documentation

## 2021-09-30 DIAGNOSIS — D61818 Other pancytopenia: Secondary | ICD-10-CM | POA: Insufficient documentation

## 2021-09-30 NOTE — Progress Notes (Signed)
[place] [phone]   Fax:([phone]  PROGRESS NOTE  Patient Care Team: Janith Lima, MD as PCP - General (Internal Medicine)  Hematological/Oncological History # Pancytopenia # Leukopenia/Neutropenia 08/22/2020: WBC 3.5, Hgb 11.7, MCV 83, Plt 149. ANC 1.2, ALC 1.7 10/15/2020: establish care with Dr. Lorenso Courier   Interval History:  Carolyn Henry 51 y.o. female with medical history significant for pancytopenia who presents for a follow up visit. The patient's last visit was on 10/15/2020. In the interim since the last visit her blood counts have remained consistently low.   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  She is currently taking isoniazid and vitamin B 6.  She notes that she has been virtually asymptomatic since her last visit.  She has had no issues with fevers, chills, sweats, nausea, vomiting or diarrhea.  Her weight has been stable and she is not having any other changes in her health.  A full 10 point ROS is listed below.  MEDICAL HISTORY:  Past Medical History:  Diagnosis Date   Deficiency anemia 09/23/2021    SURGICAL HISTORY: Past Surgical History:  Procedure Laterality Date   CESAREAN SECTION  2004    SOCIAL HISTORY: Social History   Socioeconomic History   Marital status: Married    Spouse name: Not on file   Number of children: 2   Years of education: Not on file   Highest education level: Not on file  Occupational History   Not on file  Tobacco Use   Smoking status: Never   Smokeless tobacco: Never  Vaping Use   Vaping Use: Never used  Substance and Sexual Activity   Alcohol use: Never   Drug use: Never   Sexual activity: Not Currently    Partners: Male  Other Topics Concern   Not on file  Social History Narrative   Not on file   Social Determinants of Health    Financial Resource Strain: Not on file  Food Insecurity: Not on file  Transportation Needs: Not on file  Physical Activity: Not on file  Stress: Not on file  Social Connections: Not on file  Intimate Partner Violence: Not on file    FAMILY HISTORY: Family History  Problem Relation Age of Onset   Healthy Mother    Glaucoma Father    Healthy Sister    Healthy Brother     ALLERGIES:  has No Known Allergies.  MEDICATIONS:  Current Outpatient Medications  Medication Sig Dispense Refill   VITAMIN D, ERGOCALCIFEROL, PO Take 500 mg PE by mouth daily.     No current facility-administered medications for this visit.    REVIEW OF SYSTEMS:   Constitutional: ( - ) fevers, ( - )  chills , ( - ) night sweats Eyes: ( - ) blurriness of vision, ( - ) double vision, ( - ) watery eyes Ears, nose, mouth, throat, and face: ( - ) mucositis, ( - ) sore throat Respiratory: ( - ) cough, ( - ) dyspnea, ( - ) wheezes Cardiovascular: ( - ) palpitation, ( - ) chest discomfort, ( - ) lower extremity swelling Gastrointestinal:  ( - ) nausea, ( - ) heartburn, ( - ) change in bowel habits Skin: ( - ) abnormal skin rashes Lymphatics: ( - ) new lymphadenopathy, ( - ) easy bruising Neurological: ( - ) numbness, ( - )  tingling, ( - ) new weaknesses Behavioral/Psych: ( - ) mood change, ( - ) new changes  All other systems were reviewed with the patient and are negative.  PHYSICAL EXAMINATION:  Vitals:   09/30/21 1543  BP: 121/64  Pulse: 63  Resp: 18  Temp: (!) 97 F (36.1 C)  SpO2: 100%   Filed Weights   09/30/21 1543  Weight: 148 lb 4 oz (67.2 kg)    GENERAL: Well-appearing middle-aged African-American female, alert, no distress and comfortable SKIN: skin color, texture, turgor are normal, no rashes or significant lesions EYES: conjunctiva are pink and non-injected, sclera clear LUNGS: clear to auscultation and percussion with normal breathing effort HEART: regular rate & rhythm and no  murmurs and no lower extremity edema Musculoskeletal: no cyanosis of digits and no clubbing  PSYCH: alert & oriented x 3, fluent speech NEURO: no focal motor/sensory deficits  LABORATORY DATA:  I have reviewed the data as listed CBC Latest Ref Rng & Units 09/23/2021 09/23/2021 10/15/2020  WBC 4.0 - 10.5 K/uL - 2.9(L) 2.8(L)  Hemoglobin 11.7 - 15.5 g/dL 11.3(L) 11.4(L) 11.0(L)  Hematocrit 35.0 - 45.0 % 35.3 34.8(L) 34.7(L)  Platelets 150.0 - 400.0 K/uL - 128.0(L) 144(L)    CMP Latest Ref Rng & Units 09/23/2021 10/15/2020  Glucose 70 - 99 mg/dL 88 101(H)  BUN 6 - 23 mg/dL 11 12  Creatinine 0.40 - 1.20 mg/dL 0.80 0.85  Sodium 135 - 145 mEq/L 141 144  Potassium 3.5 - 5.1 mEq/L 4.2 3.9  Chloride 96 - 112 mEq/L 105 109  CO2 19 - 32 mEq/L 29 28  Calcium 8.4 - 10.5 mg/dL 9.9 9.6  Total Protein 6.0 - 8.3 g/dL 7.7 7.6  Total Bilirubin 0.2 - 1.2 mg/dL 0.6 0.4  Alkaline Phos 39 - 117 U/L 41 48  AST 0 - 37 U/L 33 23  ALT 0 - 35 U/L 23 21    RADIOGRAPHIC STUDIES: No results found.  ASSESSMENT & PLAN Carolyn Henry 51 y.o. female with medical history significant for pancytopenia who presents for a follow up visit.   At this time the etiology of the patient's pancytopenia is unclear.  It has been stable in the interim since our last visit.  She has a decrease in all 3 cell lines but is virtually asymptomatic from this.  There is no evidence of nutritional deficiency or viral etiology in her prior work-up.  As such we will pursue an ultrasound of the liver and spleen and if this is negative would recommend a bone marrow biopsy.  The patient voiced understanding of this plan moving forward.  #Pancytopenia  -- Negative nutritional and viral etiology work-up. --Blood counts are stable from prior with white blood cell count 2.9, hemoglobin 11.3, and platelets of 128 --Discussed with patient the options moving forward including splenic ultrasound and bone marrow biopsy.  Patient was agreeable  to pursuing ultrasound first and if no clear etiology was found pursuing bone marrow biopsy --Return to clinic pending the results of the above studies.  Orders Placed This Encounter  Procedures   US Abdomen Complete    Standing Status:   Future    Standing Expiration Date:   09/30/2022    Order Specific Question:   Reason for Exam (SYMPTOM  OR DIAGNOSIS REQUIRED)    Answer:   thrombocytopenia, assess liver disease or splenomegaly    Order Specific Question:   Preferred imaging location?    Answer:   [facility]    All  questions were answered. The patient knows to call the clinic with any problems, questions or concerns.  A total of more than 30 minutes were spent on this encounter with face-to-face time and non-face-to-face time, including preparing to see the patient, ordering tests and/or medications, counseling the patient and coordination of care as outlined above.   Ledell Peoples, MD Department of Hematology/Oncology [place] [facility] Phone: [phone] Pager: [phone] Email: [email or web address]  10/06/2021 11:19 AM

## 2021-10-08 ENCOUNTER — Ambulatory Visit (HOSPITAL_COMMUNITY)
Admission: RE | Admit: 2021-10-08 | Discharge: 2021-10-08 | Disposition: A | Discharge status: Home / Self Care | Payer: 59 | Source: Ambulatory Visit | Attending: Hematology and Oncology | Admitting: Hematology and Oncology

## 2021-10-08 ENCOUNTER — Other Ambulatory Visit: Payer: Self-pay

## 2021-10-08 DIAGNOSIS — D696 Thrombocytopenia, unspecified: Secondary | ICD-10-CM | POA: Insufficient documentation

## 2021-10-08 DIAGNOSIS — N1330 Unspecified hydronephrosis: Secondary | ICD-10-CM | POA: Diagnosis not present

## 2021-10-15 ENCOUNTER — Telehealth: Payer: Self-pay | Admitting: *Deleted

## 2021-10-15 NOTE — Telephone Encounter (Signed)
Received call from patient stating she has had her US Abdomen completed and results are available. She would like to know what the next step(s) are.  Please advise.

## 2021-10-20 ENCOUNTER — Other Ambulatory Visit: Payer: Self-pay | Admitting: Hematology and Oncology

## 2021-10-20 ENCOUNTER — Telehealth: Payer: Self-pay | Admitting: *Deleted

## 2021-10-20 DIAGNOSIS — D61818 Other pancytopenia: Secondary | ICD-10-CM

## 2021-10-20 NOTE — Telephone Encounter (Signed)
Pt was returning call. RN gave her results of [place] and information from Dr Leonides Schanz. She said she has had a call from scheduling to arrange the biopsy. She will get it scheduled

## 2021-10-30 ENCOUNTER — Encounter (HOSPITAL_COMMUNITY): Payer: Self-pay

## 2021-10-30 ENCOUNTER — Other Ambulatory Visit: Payer: Self-pay

## 2021-10-30 ENCOUNTER — Ambulatory Visit (HOSPITAL_COMMUNITY)
Admission: EM | Admit: 2021-10-30 | Discharge: 2021-10-30 | Disposition: A | Discharge status: Home / Self Care | Payer: 59 | Attending: Nurse Practitioner | Admitting: Nurse Practitioner

## 2021-10-30 DIAGNOSIS — K047 Periapical abscess without sinus: Secondary | ICD-10-CM | POA: Diagnosis not present

## 2021-10-30 DIAGNOSIS — K0889 Other specified disorders of teeth and supporting structures: Secondary | ICD-10-CM

## 2021-10-30 MED ORDER — AMOXICILLIN-POT CLAVULANATE 875-125 MG PO TABS
1.0000 | ORAL_TABLET | Freq: Two times a day (BID) | ORAL | 0 refills | Status: AC
Start: 2021-10-30 — End: 2021-11-06

## 2021-10-30 NOTE — ED Triage Notes (Signed)
Pt reports L side facial swelling.   States she has an infection in her mouth.

## 2021-10-30 NOTE — ED Provider Notes (Signed)
Carolyn Henry    CSN: [id number] Arrival date & time: 10/30/21  1950      History   Chief Complaint Chief Complaint  Patient presents with   Facial Swelling    HPI Carolyn Henry is a 51 y.o. female.   Patient reports 1 day history of tooth pain.  Reports pain and swelling on the left side of her face and upper jaw.  She denies fevers, dizziness, nausea/vomiting, and diarrhea.  She denies any pus draining on the inside of her mouth.  She has not been able to eat or drink well since the pain started.  She has had multiple teeth removed in the past.   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      Home Medications    Prior to Admission medications   Medication Sig Start Date End Date Taking? Authorizing Provider  amoxicillin-clavulanate (AUGMENTIN) 875-125 MG tablet Take 1 tablet by mouth 2 (two) times daily for 7 days. 10/30/21 11/06/21 Yes Noemi Chapel A, NP  VITAMIN D, ERGOCALCIFEROL, PO Take 500 mg PE by mouth daily.    [provider]    Family History Family History  Problem Relation Age of Onset   Healthy Mother    Glaucoma Father    Healthy Sister    Healthy Brother     Social History Social History   Tobacco Use   Smoking status: Never   Smokeless tobacco: Never  Vaping Use   Vaping Use: Never used  Substance Use Topics   Alcohol use: Never   Drug use: Never     Allergies   Patient has no known allergies.   Review of Systems Review of Systems Per HPI  Physical Exam Triage Vital Signs ED Triage Vitals  Enc  Vitals Group     BP 10/30/21 2009 126/85     Pulse Rate 10/30/21 2009 67     Resp 10/30/21 2009 17     Temp 10/30/21 2009 98.2 F (36.8 C)     Temp Source 10/30/21 2009 Oral     SpO2 10/30/21 2009 99 %     Weight --      Height --      Head Circumference --      Peak Flow --      Pain Score 10/30/21 2008 8     Pain Loc --      Pain Edu? --      Excl. in [place]? --    No data found.  Updated Vital Signs BP 126/85 (BP Location: Left Arm)    Pulse 67    Temp 98.2 F (36.8 C) (Oral)    Resp 17    LMP 02/09/2012 (Approximate)    SpO2 99%   Visual Acuity Right Eye Distance:   Left Eye Distance:   Bilateral Distance:    Right Eye Near:   Left Eye Near:    Bilateral Near:     Physical Exam Vitals and nursing note reviewed.  Constitutional:      General: She is not in acute distress.  Appearance: Normal appearance. She is not toxic-appearing.  HENT:     Head: Normocephalic and atraumatic.     Right Ear: Tympanic membrane, ear canal and external ear normal.     Left Ear: Tympanic membrane, ear canal and external ear normal.     Mouth/Throat:     Mouth: Mucous membranes are dry. No oral lesions or angioedema.     Dentition: Abnormal dentition. Gingival swelling and dental caries present.     Pharynx: Oropharynx is clear. No posterior oropharyngeal erythema or uvula swelling.     Tonsils: No tonsillar exudate.      Comments: Mild gingival swelling as marked above Skin:    General: Skin is warm and dry.     Coloration: Skin is not jaundiced or pale.     Findings: No erythema.  Neurological:     Mental Status: She is alert and oriented to person, place, and time.     Motor: No weakness.     Gait: Gait normal.  Psychiatric:        Mood and Affect: Mood normal.        Behavior: Behavior normal.        Thought Content: Thought content normal.        Judgment: Judgment normal.     UC Treatments / Results  Labs (all labs ordered are listed, but only abnormal results are  displayed) Labs Reviewed - No data to display  EKG   Radiology No results found.  Procedures Procedures (including critical care time)  Medications Ordered in UC Medications - No data to display  Initial Impression / Assessment and Plan / UC Course  I have reviewed the triage vital signs and the nursing notes.  Pertinent labs & imaging results that were available during my care of the patient were reviewed by me and considered in my medical decision making (see chart for details).    Symptoms and exam consistent with dental abscess.  Treat with Augmentin twice daily for 7 days.  Encouraged alternating ibuprofen/Tylenol for dental pain.  Encouraged hydration with sugar-free electrolyte solutions and full liquids.  If symptoms worsen, with fevers, chills, body aches, nausea or vomiting, unable to keep fluids down, go to emergency room.  Patient's vital signs are stable today and she appears well. Final Clinical Impressions(s) / UC Diagnoses   Final diagnoses:  Pain, dental  Dental abscess     Discharge Instructions      Please finish the entire course of antibiotics.  Alternate Tylenol and ibuprofen for pain.  Please call your dentist Monday if your symptoms are still present.  If your symptoms worsen, go to the Emergency Room.     ED Prescriptions     Medication Sig Dispense Auth. Provider   amoxicillin-clavulanate (AUGMENTIN) 875-125 MG tablet Take 1 tablet by mouth 2 (two) times daily for 7 days. 14 tablet Eulogio Bear, NP      PDMP not reviewed this encounter.   Eulogio Bear, NP 10/30/21 2039

## 2021-10-30 NOTE — Discharge Instructions (Addendum)
Please finish the entire course of antibiotics.  Alternate Tylenol and ibuprofen for pain.  Please call your dentist Monday if your symptoms are still present.  If your symptoms worsen, go to the Emergency Room.

## 2021-11-12 ENCOUNTER — Ambulatory Visit (HOSPITAL_COMMUNITY): Payer: 59

## 2021-11-20 ENCOUNTER — Other Ambulatory Visit: Payer: Self-pay | Admitting: Radiology

## 2021-11-23 ENCOUNTER — Ambulatory Visit (HOSPITAL_COMMUNITY)
Admission: RE | Admit: 2021-11-23 | Discharge: 2021-11-23 | Disposition: A | Discharge status: Home / Self Care | Payer: 59 | Source: Ambulatory Visit | Attending: Hematology and Oncology | Admitting: Hematology and Oncology

## 2021-11-23 ENCOUNTER — Other Ambulatory Visit: Payer: Self-pay

## 2021-11-23 ENCOUNTER — Encounter (HOSPITAL_COMMUNITY): Payer: Self-pay

## 2021-11-23 DIAGNOSIS — D61818 Other pancytopenia: Secondary | ICD-10-CM | POA: Diagnosis not present

## 2021-11-23 LAB — CBC WITH DIFFERENTIAL/PLATELET
Abs Immature Granulocytes: 0 10*3/uL (ref 0.00–0.07)
Basophils Absolute: 0 10*3/uL (ref 0.0–0.1)
Basophils Relative: 1 %
Eosinophils Absolute: 0.1 10*3/uL (ref 0.0–0.5)
Eosinophils Relative: 5 %
HCT: 35.4 % — ABNORMAL LOW (ref 36.0–46.0)
Hemoglobin: 11.3 g/dL — ABNORMAL LOW (ref 12.0–15.0)
Immature Granulocytes: 0 %
Lymphocytes Relative: 54 %
Lymphs Abs: 1.4 10*3/uL (ref 0.7–4.0)
MCH: 26.8 pg (ref 26.0–34.0)
MCHC: 31.9 g/dL (ref 30.0–36.0)
MCV: 84.1 fL (ref 80.0–100.0)
Monocytes Absolute: 0.3 10*3/uL (ref 0.1–1.0)
Monocytes Relative: 12 %
Neutro Abs: 0.7 10*3/uL — ABNORMAL LOW (ref 1.7–7.7)
Neutrophils Relative %: 28 %
Platelets: 128 10*3/uL — ABNORMAL LOW (ref 150–400)
RBC: 4.21 MIL/uL (ref 3.87–5.11)
RDW: 13.2 % (ref 11.5–15.5)
WBC: 2.5 10*3/uL — ABNORMAL LOW (ref 4.0–10.5)
nRBC: 0 % (ref 0.0–0.2)

## 2021-11-23 LAB — NO BLOOD PRODUCTS

## 2021-11-23 MED ORDER — LIDOCAINE HCL (PF) 1 % IJ SOLN
INTRAMUSCULAR | Status: AC | PRN
  Administered 2021-11-23: 10 mL via INTRADERMAL

## 2021-11-23 MED ORDER — NALOXONE HCL 0.4 MG/ML IJ SOLN
INTRAMUSCULAR | Status: AC
  Filled 2021-11-23: qty 1

## 2021-11-23 MED ORDER — SODIUM CHLORIDE 0.9 % IV SOLN
INTRAVENOUS | Status: AC
Start: 2021-11-23 — End: 2021-11-23
  Filled 2021-11-23: qty 250

## 2021-11-23 MED ORDER — FENTANYL CITRATE (PF) 100 MCG/2ML IJ SOLN
INTRAMUSCULAR | Status: AC
  Filled 2021-11-23: qty 2

## 2021-11-23 MED ORDER — FLUMAZENIL 0.5 MG/5ML IV SOLN
INTRAVENOUS | Status: AC
  Filled 2021-11-23: qty 5

## 2021-11-23 MED ORDER — MIDAZOLAM HCL 2 MG/2ML IJ SOLN
INTRAMUSCULAR | Status: AC
  Filled 2021-11-23: qty 4

## 2021-11-23 MED ORDER — MIDAZOLAM HCL 2 MG/2ML IJ SOLN
INTRAMUSCULAR | Status: AC | PRN
  Administered 2021-11-23: 1 mg via INTRAVENOUS

## 2021-11-23 MED ORDER — FENTANYL CITRATE (PF) 100 MCG/2ML IJ SOLN
INTRAMUSCULAR | Status: AC | PRN
Start: 2021-11-23 — End: 2021-11-23
  Administered 2021-11-23: 50 ug via INTRAVENOUS

## 2021-11-23 MED ORDER — SODIUM CHLORIDE 0.9 % IV SOLN
INTRAVENOUS | Status: DC
Start: 2021-11-23 — End: 2021-11-24

## 2021-11-23 NOTE — Procedures (Signed)
Pre-procedure Diagnosis: Pancytopenia ?Post-procedure Diagnosis: Same ? ?Technically successful CT guided bone marrow aspiration and biopsy of left iliac crest.  ? ?Complications: None Immediate ? ?EBL: None ? ?Signed: ?Simonne Come ?Pager: [phone] ?11/23/2021, 9:40 AM ? ? ?

## 2021-11-23 NOTE — Discharge Instructions (Signed)

## 2021-11-23 NOTE — Consult Note (Addendum)
? ?Chief Complaint: ?Patient was seen in consultation today for  CT guided bone marrow biopsy ? ?Referring Physician(s): ?Dorsey,John T IV ? ?Supervising Physician: Sandi Mariscal ? ?Patient Status: [place] ? ?History of Present Illness: ?Carolyn Henry is a 51 y.o. female , [place] witness, with history of pancytopenia of uncertain etiology who presents today for CT-guided bone marrow biopsy for further evaluation. ? ? ?Past Medical History:  ?Diagnosis Date  ? 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 ? ?Past Surgical History:  ?Procedure Laterality Date  ? CESAREAN SECTION  2004  ? ? ?Allergies: ?Patient has no known allergies. ? ?Medications: ?Prior to Admission medications   ?Medication Sig Start Date End Date Taking? Authorizing Provider  ?VITAMIN D, ERGOCALCIFEROL, PO Take 500 mg PE by mouth daily.    [provider]  ?  ? ?Family History  ?Problem Relation Age of Onset  ? Healthy Mother   ? Glaucoma Father   ? Healthy Sister   ? Healthy Brother   ? ? ?Social History  ? ?Socioeconomic History  ? Marital status: Married  ?  Spouse name: Not on file  ? Number of children: 2  ? Years of education: Not on file  ? Highest education level: Not on file  ?Occupational History  ? Not on file  ?Tobacco Use  ? Smoking status: Never  ? Smokeless tobacco: Never  ?Vaping Use  ? Vaping Use: Never used  ?Substance and Sexual Activity  ? Alcohol use: Never  ? Drug use: Never  ? Sexual activity: Not Currently  ?  Partners: Male  ?Other Topics Concern  ? Not on file  ?Social History Narrative  ? Not on file  ? ?Social Determinants of Health  ? ?Financial Resource Strain: Not on file  ?Food Insecurity: Not on file  ?Transportation Needs: Not on file  ?Physical Activity: Not on file  ?Stress: Not on file  ?Social Connections: Not on file  ? ? ? ? ?Review of Systems currently denies fever, headache, chest pain, dyspnea, cough, abdominal/back pain, nausea, vomiting or bleeding ? ?Vital Signs: ?BP 122/66   Pulse  (!) 56   Temp 98.2 ?F (36.8 ?C) (Oral)   Resp 16   LMP 02/09/2012 (Approximate)   SpO2 100%  ? ?Physical Exam awake, alert.  Chest clear to auscultation bilaterally.  Heart with slightly bradycardic but regular rhythm.  Abdomen soft, positive bowel sounds, nontender.  No lower extremity edema. ? ?Imaging: ?No results found. ? ?Labs: ? ?CBC: ?Recent Labs  ?  09/23/21 ?1204  ?WBC 2.9*  ?HGB 11.3*  11.4*  ?HCT 35.3  34.8*  ?PLT 128.0*  ? ? ?COAGS: ?No results for input(s): INR, APTT in the last 8760 hours. ? ?BMP: ?Recent Labs  ?  09/23/21 ?1204  ?NA 141  ?K 4.2  ?CL 105  ?CO2 29  ?GLUCOSE 88  ?BUN 11  ?CALCIUM 9.9  ?CREATININE 0.80  ? ? ?LIVER FUNCTION TESTS: ?Recent Labs  ?  09/23/21 ?1610  ?BILITOT 0.6  ?AST 33  ?ALT 23  ?ALKPHOS 41  ?PROT 7.7  ?ALBUMIN 4.5  ? ? ?TUMOR MARKERS: ?No results for input(s): AFPTM, CEA, CA199, CHROMGRNA in the last 8760 hours. ? ?Assessment and Plan: ?51 y.o. female, Je[employer]'s Witness, with history of pancytopenia of uncertain etiology who presents today for CT-guided bone marrow biopsy for further evaluation.Risks and benefits of procedure was discussed with the patient  including, but not limited to bleeding, infection, damage to adjacent structures or low yield requiring  additional tests. ? ?All of the questions were answered and there is agreement to proceed. ? ?Consent signed and in chart. ? ? ? ?Thank you for this interesting consult.  I greatly enjoyed meeting [employer] and look forward to participating in their care.  A copy of this report was sent to the requesting provider on this date. ? ?Electronically Signed: ?Autumn Messing, PA-C ?11/23/2021, 7:57 AM ? ? ?I spent a total of   20 minutes  in face to face in clinical consultation, greater than 50% of which was counseling/coordinating care for CT-guided bone marrow biopsy ? ?

## 2021-11-25 LAB — SURGICAL PATHOLOGY

## 2021-12-21 ENCOUNTER — Other Ambulatory Visit (HOSPITAL_COMMUNITY)
Admission: RE | Admit: 2021-12-21 | Discharge: 2021-12-21 | Disposition: A | Discharge status: Home / Self Care | Payer: 59 | Source: Ambulatory Visit | Attending: Obstetrics & Gynecology | Admitting: Obstetrics & Gynecology

## 2021-12-21 ENCOUNTER — Ambulatory Visit (INDEPENDENT_AMBULATORY_CARE_PROVIDER_SITE_OTHER): Payer: 59 | Admitting: Obstetrics & Gynecology

## 2021-12-21 ENCOUNTER — Encounter: Payer: Self-pay | Admitting: Obstetrics & Gynecology

## 2021-12-21 VITALS — BP 112/70 | HR 67 | Wt 152.5 lb

## 2021-12-21 DIAGNOSIS — Z1151 Encounter for screening for human papillomavirus (HPV): Secondary | ICD-10-CM | POA: Insufficient documentation

## 2021-12-21 DIAGNOSIS — Z01419 Encounter for gynecological examination (general) (routine) without abnormal findings: Secondary | ICD-10-CM

## 2021-12-21 DIAGNOSIS — Z113 Encounter for screening for infections with a predominantly sexual mode of transmission: Secondary | ICD-10-CM

## 2021-12-21 DIAGNOSIS — Z1231 Encounter for screening mammogram for malignant neoplasm of breast: Secondary | ICD-10-CM | POA: Diagnosis not present

## 2021-12-21 NOTE — Progress Notes (Signed)
Patient mammogram scheduled for 12/31/21 at 11:40 [facility]. Patient notified.  ?

## 2021-12-21 NOTE — Patient Instructions (Signed)
Patient mammogram scheduled for 12/31/21 at 11:40 AM at the [facility].  ? ?[facility] ?[address] [place], [place] ?In [facility]  ?[phone] ? ?

## 2021-12-21 NOTE — Progress Notes (Signed)
? ? ?GYNECOLOGY ANNUAL PREVENTATIVE CARE ENCOUNTER NOTE ? ?History:    ? Carolyn Henry is a 51 y.o. G76P1001 female here for a routine annual gynecologic exam.  Current complaints: none.  Desires pap and annual STI screen   Denies abnormal vaginal bleeding, discharge, pelvic pain, problems with intercourse or other gynecologic concerns.  ?  ?Gynecologic History ?Patient's last menstrual period was 02/09/2012 (approximate). ?Last Pap: Several years ago. Result was normal with negative HPV ?Last Mammogram: 11/14/2020.  Result was normal ?Last Colonoscopy: Reports she had a normal one 2 years ago.   ? ?Obstetric History ?OB History  ?Gravida Para Term Preterm AB Living  ?1 1 1     1   ?SAB IAB Ectopic Multiple Live Births  ?        1  ?  ?# Outcome Date GA Lbr Len/2nd Weight Sex Delivery Anes PTL Lv  ?1 Term 2004    M CS-LTranv   LIV  ? ? ?Past Medical History:  ?Diagnosis Date  ? 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 ? ?Past Surgical History:  ?Procedure Laterality Date  ? CESAREAN SECTION  2004  ? ? ?Current Outpatient Medications on File Prior to Visit  ?Medication Sig Dispense Refill  ? multivitamin-iron-minerals-folic acid (CENTRUM) chewable tablet Chew 1 tablet by mouth daily.    ? VITAMIN D, ERGOCALCIFEROL, PO Take 500 mg PE by mouth daily.    ? ?No current facility-administered medications on file prior to visit.  ? ? ?No Known Allergies ? ?Social History:  reports that she has never smoked. She has never used smokeless tobacco. She reports that she does not drink alcohol and does not use drugs. ? ?Family History  ?Problem Relation Age of Onset  ? Healthy Mother   ? Glaucoma Father   ? Healthy Sister   ? Healthy Brother   ? ? ?The following portions of the patient's history were reviewed and updated as appropriate: allergies, current medications, past family history, past medical history, past social history, past surgical history and problem list. ? ?Review of Systems ?Pertinent items noted in HPI and  remainder of comprehensive ROS otherwise negative. ? ?Physical Exam:  ?BP 112/70   Pulse 67   Wt 152 lb 8 oz (69.2 kg)   LMP 02/09/2012 (Approximate)   BMI 25.38 kg/m?  ?CONSTITUTIONAL: Well-developed, well-nourished female in no acute distress.  ?HENT:  Normocephalic, atraumatic, External right and left ear normal.  ?EYES: Conjunctivae and EOM are normal. Pupils are equal, round, and reactive to light. No scleral icterus.  ?NECK: Normal range of motion, supple, no masses.  Normal thyroid.  ?SKIN: Skin is warm and dry. No rash noted. Not diaphoretic. No erythema. No pallor. ?MUSCULOSKELETAL: Normal range of motion. No tenderness.  No cyanosis, clubbing, or edema. ?NEUROLOGIC: Alert and oriented to person, place, and time. Normal reflexes, muscle tone coordination.  ?PSYCHIATRIC: Normal mood and affect. Normal behavior. Normal judgment and thought content. ?CARDIOVASCULAR: Normal heart rate noted, regular rhythm ?RESPIRATORY: Clear to auscultation bilaterally. Effort and breath sounds normal, no problems with respiration noted. ?BREASTS: Symmetric in size. No masses, tenderness, skin changes, nipple drainage, or lymphadenopathy bilaterally. Performed in the presence of a chaperone. ?ABDOMEN: Soft, no distention noted.  No tenderness, rebound or guarding.  ?PELVIC: Normal appearing external genitalia and urethral meatus; normal appearing vaginal mucosa and cervix.  Mild atrophy noted. No abnormal vaginal discharge noted.  Pap smear obtained.  Normal uterine size, no other palpable masses, no uterine or adnexal tenderness.  Performed in  the presence of a chaperone. ?  ?Assessment and Plan:  ?   ?1. Breast cancer screening by mammogram ?Mammogram scheduled ?- MM 3D SCREEN BREAST BILATERAL; Future ? ?2. Screening examination for STD (sexually transmitted disease) ?STI screen done, will follow up results and manage accordingly. ?- Cytology - PAP( [place]) ?- RPR+HBsAg+HCVAb+... ? ?3. Encounter for annual routine  gynecological examination ?- Cytology - PAP( [place]) ?Will follow up results of pap smear and manage accordingly. ?Colon cancer screening is up to date ?Routine preventative health maintenance measures emphasized. ?Please refer to After Visit Summary for other counseling recommendations.  ?   ? ?Verita Schneiders, MD, FACOG ?Obstetrician Social research officer, government, Faculty Practice ?[facility] ? ?

## 2021-12-22 LAB — RPR+HBSAG+HCVAB+...
HIV Screen 4th Generation wRfx: NONREACTIVE
Hep C Virus Ab: NONREACTIVE
Hepatitis B Surface Ag: NEGATIVE
RPR Ser Ql: NONREACTIVE

## 2021-12-31 ENCOUNTER — Ambulatory Visit
Admission: RE | Admit: 2021-12-31 | Discharge: 2021-12-31 | Disposition: A | Discharge status: Home / Self Care | Payer: 59 | Source: Ambulatory Visit | Attending: Obstetrics & Gynecology | Admitting: Obstetrics & Gynecology

## 2021-12-31 DIAGNOSIS — Z1231 Encounter for screening mammogram for malignant neoplasm of breast: Secondary | ICD-10-CM | POA: Diagnosis not present

## 2022-01-01 LAB — CYTOLOGY - PAP
Chlamydia: NEGATIVE
Comment: NEGATIVE
Comment: NEGATIVE
Comment: NEGATIVE
Comment: NEGATIVE
Comment: NEGATIVE
Comment: NORMAL
HPV 16: NEGATIVE
HPV 18 / 45: NEGATIVE
High risk HPV: POSITIVE — AB
Neisseria Gonorrhea: NEGATIVE
Trichomonas: NEGATIVE

## 2022-01-04 ENCOUNTER — Encounter: Payer: Self-pay | Admitting: Obstetrics & Gynecology

## 2022-01-04 ENCOUNTER — Telehealth: Payer: Self-pay

## 2022-01-04 NOTE — Telephone Encounter (Signed)
-----   Message from Tereso Newcomer, MD sent at 01/04/2022  9:08 AM EDT ----- ?Please schedule patient for colposcopy and endometrial biopsy for AGC +HPV pap done on 12/21/21. Please call to inform patient of results and need for appointment. ? ?

## 2022-01-04 NOTE — Telephone Encounter (Signed)
Call placed to pt. Spoke with pt. Pt given results and recommendations per Dr Domenica Reamer. Pt verbalized understanding and agreeable to plan of care. Will send message to front office to schedule Colpo and EMB. Pt will be contacted by front office for confirmation of appt.  ?Colletta Maryland, RN  ?

## 2022-01-25 ENCOUNTER — Encounter: Payer: Self-pay | Admitting: Internal Medicine

## 2022-01-25 ENCOUNTER — Ambulatory Visit (INDEPENDENT_AMBULATORY_CARE_PROVIDER_SITE_OTHER): Payer: 59 | Admitting: Internal Medicine

## 2022-01-25 VITALS — BP 126/72 | HR 60 | Temp 98.4°F | Ht 65.0 in | Wt 150.0 lb

## 2022-01-25 DIAGNOSIS — D61818 Other pancytopenia: Secondary | ICD-10-CM

## 2022-01-25 LAB — CBC WITH DIFFERENTIAL/PLATELET
Basophils Absolute: 0 10*3/uL (ref 0.0–0.1)
Basophils Relative: 0.6 % (ref 0.0–3.0)
Eosinophils Absolute: 0.1 10*3/uL (ref 0.0–0.7)
Eosinophils Relative: 4.6 % (ref 0.0–5.0)
HCT: 33.2 % — ABNORMAL LOW (ref 36.0–46.0)
Hemoglobin: 11.1 g/dL — ABNORMAL LOW (ref 12.0–15.0)
Lymphocytes Relative: 49.2 % — ABNORMAL HIGH (ref 12.0–46.0)
Lymphs Abs: 1.3 10*3/uL (ref 0.7–4.0)
MCHC: 33.4 g/dL (ref 30.0–36.0)
MCV: 81.2 fl (ref 78.0–100.0)
Monocytes Absolute: 0.3 10*3/uL (ref 0.1–1.0)
Monocytes Relative: 11.9 % (ref 3.0–12.0)
Neutro Abs: 0.9 10*3/uL — ABNORMAL LOW (ref 1.4–7.7)
Neutrophils Relative %: 33.7 % — ABNORMAL LOW (ref 43.0–77.0)
Platelets: 115 10*3/uL — ABNORMAL LOW (ref 150.0–400.0)
RBC: 4.09 Mil/uL (ref 3.87–5.11)
RDW: 13.5 % (ref 11.5–15.5)
WBC: 2.7 10*3/uL — ABNORMAL LOW (ref 4.0–10.5)

## 2022-01-25 NOTE — Progress Notes (Signed)
Subjective:  Patient ID: Carolyn Henry, female    DOB: 03-29-1971  Age: 51 y.o. MRN: [id number]  CC: Anemia   HPI Carolyn Henry presents for f/up -  She underwent a bone marrow biopsy 2 months ago but says she did not get any results from her Heme/Onc doctor. She complains of chronic fatigue but offers no other complaints.  Outpatient Medications Prior to Visit  Medication Sig Dispense Refill   multivitamin-iron-minerals-folic acid (CENTRUM) chewable tablet Chew 1 tablet by mouth daily.     VITAMIN D, ERGOCALCIFEROL, PO Take 500 mg PE by mouth daily.     No facility-administered medications prior to visit.    ROS Review of Systems  Constitutional:  Positive for fatigue. Negative for appetite change, chills, diaphoresis, fever and unexpected weight change.  HENT: Negative.    Eyes: Negative.   Respiratory:  Negative for cough and shortness of breath.   Cardiovascular:  Negative for chest pain, palpitations and leg swelling.  Gastrointestinal:  Negative for abdominal pain, constipation, diarrhea, nausea and vomiting.  Endocrine: Negative.   Genitourinary: Negative.  Negative for difficulty urinating.  Musculoskeletal: Negative.   Skin: Negative.   Neurological:  Negative for dizziness, weakness and light-headedness.  Hematological:  Negative for adenopathy. Does not bruise/bleed easily.  Psychiatric/Behavioral: Negative.     Objective:  BP 126/72 (BP Location: Left Arm, Patient Position: Sitting, Cuff Size: Large)   Pulse 60   Temp 98.4 F (36.9 C) (Oral)   Ht 5' 5"  (1.651 m)   Wt 150 lb (68 kg)   LMP 02/09/2012 (Approximate)   SpO2 96%   BMI 24.96 kg/m   BP Readings from Last 3 Encounters:  01/25/22 126/72  12/21/21 112/70  11/23/21 112/68    Wt Readings from Last 3 Encounters:  01/25/22 150 lb (68 kg)  12/21/21 152 lb 8 oz (69.2 kg)  11/23/21 149 lb (67.6 kg)    Physical Exam Vitals reviewed.  HENT:     Nose: Nose normal.      Mouth/Throat:     Mouth: Mucous membranes are moist.  Eyes:     General: No scleral icterus.    Conjunctiva/sclera: Conjunctivae normal.  Cardiovascular:     Rate and Rhythm: Normal rate and regular rhythm.     Heart sounds: No murmur heard.   No friction rub. No gallop.  Pulmonary:     Effort: Pulmonary effort is normal.     Breath sounds: No stridor. No wheezing, rhonchi or rales.  Abdominal:     General: Abdomen is flat.     Palpations: There is no mass.     Tenderness: There is no abdominal tenderness. There is no guarding.     Hernia: No hernia is present.  Musculoskeletal:        General: Normal range of motion.     Cervical back: Neck supple.     Right lower leg: No edema.     Left lower leg: No edema.  Lymphadenopathy:     Cervical: No cervical adenopathy.  Skin:    General: Skin is warm and dry.  Neurological:     General: No focal deficit present.     Mental Status: She is alert.  Psychiatric:        Mood and Affect: Mood normal.        Behavior: Behavior normal.    Lab Results  Component Value Date   WBC 2.7 (L) 01/25/2022   HGB 11.1 (L) 01/25/2022   HCT 33.2 (  L) 01/25/2022   PLT 115.0 (L) 01/25/2022   GLUCOSE 88 09/23/2021   CHOL 240 (H) 09/23/2021   TRIG 53.0 09/23/2021   HDL 82.20 09/23/2021   LDLCALC 148 (H) 09/23/2021   ALT 23 09/23/2021   AST 33 09/23/2021   NA 141 09/23/2021   K 4.2 09/23/2021   CL 105 09/23/2021   CREATININE 0.80 09/23/2021   BUN 11 09/23/2021   CO2 29 09/23/2021   TSH 2.45 09/23/2021    MM 3D SCREEN BREAST BILATERAL  Result Date: 01/01/2022 CLINICAL DATA:  Screening. EXAM: DIGITAL SCREENING BILATERAL MAMMOGRAM WITH TOMOSYNTHESIS AND CAD TECHNIQUE: Bilateral screening digital craniocaudal and mediolateral oblique mammograms were obtained. Bilateral screening digital breast tomosynthesis was performed. The images were evaluated with computer-aided detection. COMPARISON:  Previous exam(s). ACR Breast Density Category b: There  are scattered areas of fibroglandular density. FINDINGS: There are no findings suspicious for malignancy. IMPRESSION: No mammographic evidence of malignancy. A result letter of this screening mammogram will be mailed directly to the patient. RECOMMENDATION: Screening mammogram in one year. (Code:SM-B-01Y) BI-RADS CATEGORY  1: Negative. Electronically Signed   By: Margarette Canada M.D.   On: 01/01/2022 09:50    Assessment & Plan:   Earlene was seen today for anemia.  Diagnoses and all orders for this visit:  Pancytopenia ([place])- She remains pancytopenic. I have asked her to stay on touch with Heme/Onc. -     CBC with Differential/Platelet; Future -     CBC with Differential/Platelet   I am having Kerrin Champagne. Mascari "Margarette" maintain her (VITAMIN D, ERGOCALCIFEROL, PO) and multivitamin-iron-minerals-folic acid.  No orders of the defined types were placed in this encounter.    Follow-up: No follow-ups on file.  Scarlette Calico, MD

## 2022-02-25 ENCOUNTER — Ambulatory Visit: Payer: 59 | Admitting: Obstetrics & Gynecology

## 2022-04-22 ENCOUNTER — Telehealth: Payer: Self-pay | Admitting: Lactation Services

## 2022-04-22 NOTE — Telephone Encounter (Signed)
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 Patient was scheduled for EMB and Colpo on 02/25/2022 and did not show.   Called patient to discuss results and need for follow up. She did not answer. LM on her voicemail to call the office at her convenience.

## 2022-04-23 NOTE — Telephone Encounter (Signed)
Attempted to call patient in regards to abnormal Pap and need for EMB and Colpo. Patient did not answer. LM for her to call the office at [phone] in regards to needing to schedule a follow up appointment in the office. Will send letter to patient.

## 2022-05-04 DIAGNOSIS — H524 Presbyopia: Secondary | ICD-10-CM | POA: Diagnosis not present

## 2022-06-02 ENCOUNTER — Encounter: Payer: Self-pay | Admitting: Family Medicine

## 2022-06-02 ENCOUNTER — Ambulatory Visit (INDEPENDENT_AMBULATORY_CARE_PROVIDER_SITE_OTHER): Payer: 59

## 2022-06-02 ENCOUNTER — Ambulatory Visit (INDEPENDENT_AMBULATORY_CARE_PROVIDER_SITE_OTHER): Payer: 59 | Admitting: Family Medicine

## 2022-06-02 VITALS — BP 120/68 | HR 60 | Temp 97.6°F | Ht 65.0 in | Wt 145.0 lb

## 2022-06-02 DIAGNOSIS — M25531 Pain in right wrist: Secondary | ICD-10-CM | POA: Diagnosis not present

## 2022-06-02 DIAGNOSIS — M7989 Other specified soft tissue disorders: Secondary | ICD-10-CM | POA: Diagnosis not present

## 2022-06-02 DIAGNOSIS — M25541 Pain in joints of right hand: Secondary | ICD-10-CM

## 2022-06-02 DIAGNOSIS — Z23 Encounter for immunization: Secondary | ICD-10-CM | POA: Diagnosis not present

## 2022-06-02 DIAGNOSIS — M79644 Pain in right finger(s): Secondary | ICD-10-CM | POA: Diagnosis not present

## 2022-06-02 MED ORDER — DICLOFENAC SODIUM 1 % EX GEL: 2.0000 g | Freq: Four times a day (QID) | CUTANEOUS | 0 refills | Status: AC

## 2022-06-02 MED ORDER — IBUPROFEN 600 MG PO TABS: 600.0000 mg | ORAL_TABLET | Freq: Three times a day (TID) | ORAL | 0 refills | Status: DC | PRN

## 2022-06-02 NOTE — Patient Instructions (Signed)
Please go downstairs for an X ray.   Use ice and the Voltaren gel. Take ibuprofen 600 mg with food 2-3 times per day.   Get a wrist/thumb splint over the counter.   Follow up in 2 weeks or sooner if needed.

## 2022-06-02 NOTE — Progress Notes (Signed)
Subjective:     Patient ID: Carolyn Henry, female    DOB: October 22, 1970, 51 y.o.   MRN: [id number]  Chief Complaint  Patient presents with   hand swelling    Right hand swelling and painful since last Sunday. States it was so bad she wasn't able to hold a pen but swelling has gone down some.     HPI Patient is in today for 4 day history of right medial wrist and thumb pain and swelling. NKI  Swelling has improved.  Right handed.  No fever, chills, numbness, tingling or weakness.   Works as Chartered certified accountant.   Health Maintenance Due  Topic Date Due   TETANUS/TDAP  Never done   Zoster Vaccines- Shingrix (1 of 2) Never done    Past Medical History:  Diagnosis Date   Deficiency anemia 09/23/2021    Past Surgical History:  Procedure Laterality Date   CESAREAN SECTION  2004    Family History  Problem Relation Age of Onset   Healthy Mother    Glaucoma Father    Healthy Sister    Healthy Brother     Social History   Socioeconomic History   Marital status: Married    Spouse name: Not on file   Number of children: 2   Years of education: Not on file   Highest education level: Not on file  Occupational History   Not on file  Tobacco Use   Smoking status: Never   Smokeless tobacco: Never  Vaping Use   Vaping Use: Never used  Substance and Sexual Activity   Alcohol use: Never   Drug use: Never   Sexual activity: Not Currently    Partners: Male  Other Topics Concern   Not on file  Social History Narrative   Not on file   Social Determinants of Health   Financial Resource Strain: Not on file  Food Insecurity: Food Insecurity Present (12/21/2021)   Hunger Vital Sign    Worried About Running Out of Food in the Last Year: Never true    Ran Out of Food in the Last Year: Sometimes true  Transportation Needs: No Transportation Needs (12/21/2021)   PRAPARE - Hydrologist (Medical): No    Lack of Transportation (Non-Medical): No   Physical Activity: Not on file  Stress: Not on file  Social Connections: Not on file  Intimate Partner Violence: Not on file    Outpatient Medications Prior to Visit  Medication Sig Dispense Refill   multivitamin-iron-minerals-folic acid (CENTRUM) chewable tablet Chew 1 tablet by mouth daily.     VITAMIN D, ERGOCALCIFEROL, PO Take 500 mg PE by mouth daily.     No facility-administered medications prior to visit.    No Known Allergies  ROS     Objective:    Physical Exam Constitutional:      General: She is not in acute distress.    Appearance: She is not ill-appearing.  Cardiovascular:     Rate and Rhythm: Normal rate.  Pulmonary:     Effort: Pulmonary effort is normal.  Musculoskeletal:     Right forearm: Normal.     Right wrist: Swelling and tenderness present. No crepitus. Normal range of motion. Normal pulse.     Left wrist: Normal.     Right hand: Tenderness present. No swelling. Normal strength. Normal sensation. Normal capillary refill. Normal pulse.     Left hand: Normal.     Comments: TTP to right thumb MCP  joint and right radial wrist  No erythema and weakness.   Skin:    General: Skin is warm and dry.     Capillary Refill: Capillary refill takes less than 2 seconds.  Neurological:     General: No focal deficit present.     Mental Status: She is alert and oriented to person, place, and time.     Sensory: No sensory deficit.     Motor: No weakness.  Psychiatric:        Mood and Affect: Mood normal.        Behavior: Behavior normal.     BP 120/68 (BP Location: Left Arm, Patient Position: Sitting, Cuff Size: Large)   Pulse 60   Temp 97.6 F (36.4 C) (Temporal)   Ht 5\' 5"  (1.651 m)   Wt 145 lb (65.8 kg)   LMP 02/09/2012 (Approximate)   SpO2 96%   BMI 24.13 kg/m  Wt Readings from Last 3 Encounters:  06/02/22 145 lb (65.8 kg)  01/25/22 150 lb (68 kg)  12/21/21 152 lb 8 oz (69.2 kg)       Assessment & Plan:   Problem List Items Addressed This  Visit   None Visit Diagnoses     Acute pain of right wrist    -  Primary   Relevant Medications   ibuprofen (ADVIL) 600 MG tablet   diclofenac Sodium (VOLTAREN) 1 % GEL   Other Relevant Orders   DG Wrist Complete Right   DG Finger Thumb Right   Need for influenza vaccination       Relevant Orders   Flu Vaccine QUAD 23mo+IM (Fluarix, Fluzone & Alfiuria Quad PF) (Completed)   Pain in thumb joint with movement of right hand       Relevant Medications   ibuprofen (ADVIL) 600 MG tablet   diclofenac Sodium (VOLTAREN) 1 % GEL   Other Relevant Orders   DG Wrist Complete Right   DG Finger Thumb Right      No red flag symptoms.  Wrist and thumb x-rays ordered.  Discussed conservative treatment with ice, Voltaren gel which I prescribed, splint (OTC) and ibuprofen 600 mg prescribed.  Follow-up in 2 weeks or sooner if needed.  I will refer her to Ortho if she is not improving.  I am having Carolyn Allcorn. Hollenberg "Carolyn Henry" start on ibuprofen and diclofenac Sodium. I am also having her maintain her (VITAMIN D, ERGOCALCIFEROL, PO) and multivitamin-iron-minerals-folic acid.  Meds ordered this encounter  Medications   ibuprofen (ADVIL) 600 MG tablet    Sig: Take 1 tablet (600 mg total) by mouth every 8 (eight) hours as needed.    Dispense:  30 tablet    Refill:  0    Order Specific Question:   Supervising Provider    Answer:   Pricilla Holm A [[id number]]   diclofenac Sodium (VOLTAREN) 1 % GEL    Sig: Apply 2 g topically 4 (four) times daily.    Dispense:  150 g    Refill:  0    Order Specific Question:   Supervising Provider    Answer:   Pricilla Holm A [id number]

## 2022-11-01 ENCOUNTER — Other Ambulatory Visit (HOSPITAL_COMMUNITY): Payer: Self-pay

## 2022-11-11 DIAGNOSIS — H1132 Conjunctival hemorrhage, left eye: Secondary | ICD-10-CM | POA: Diagnosis not present

## 2023-02-25 IMAGING — CT CT BIOPSY
1 of 3 series · 14 of 29 positions shown, 18 images · non-contrast
Comparison: none

INDICATION: Pancytopenia. Please perform CT-guided bone marrow biopsy for tissue
diagnostic purposes.

[Series 2: i-spiral 5.0 br40 · axial · 0.92mm/px · z∈[-90,-37]mm · 14 of 19 slices shown, 18 images]
[im 2/19  mediastinal]
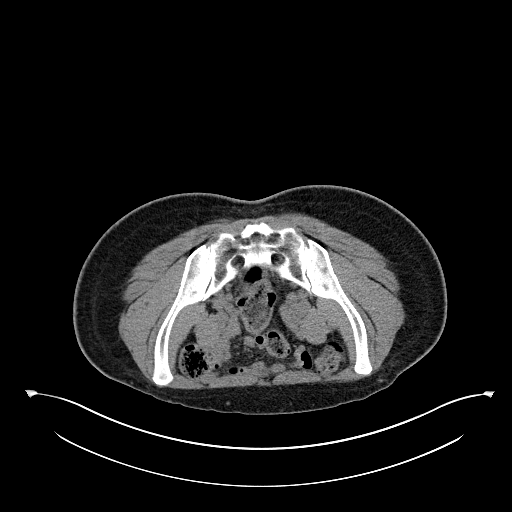
[im 2/19  lung]
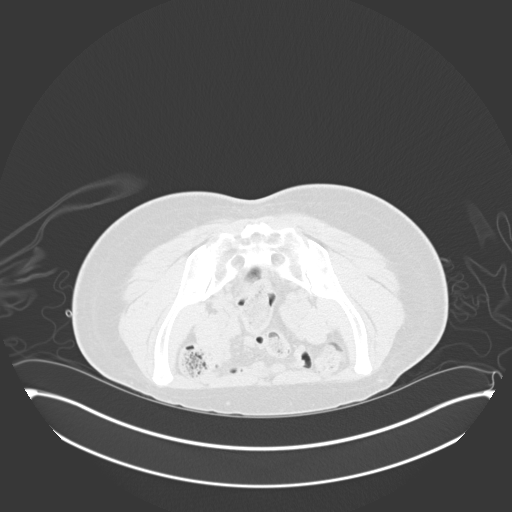
[im 3/19  lung]
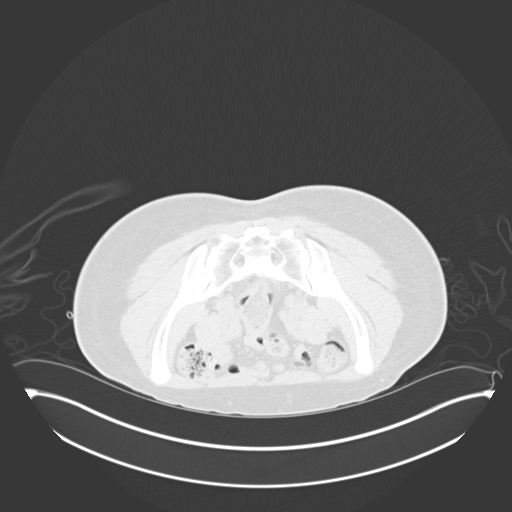
[im 4/19  lung]
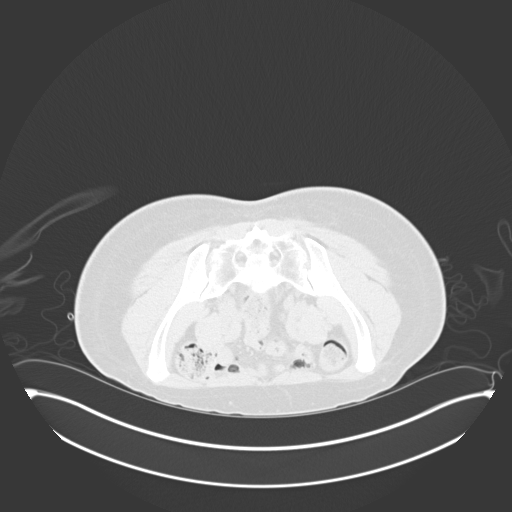
[im 5/19  lung]
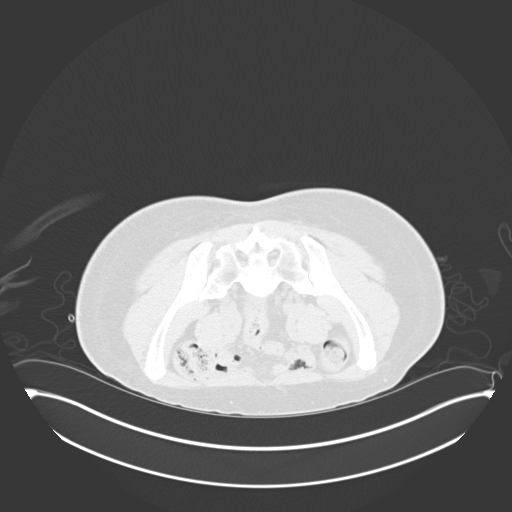
[im 6/19  mediastinal]
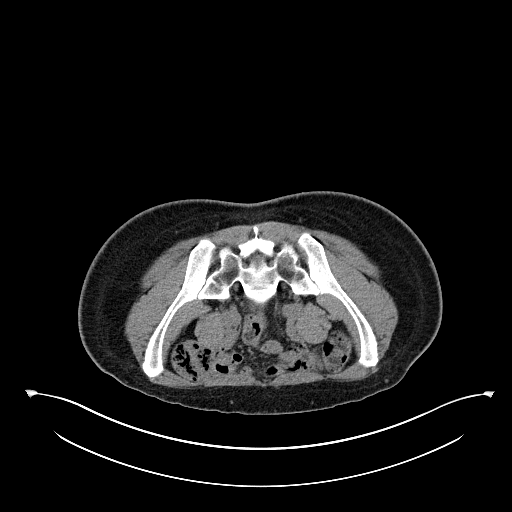
[im 6/19  lung]
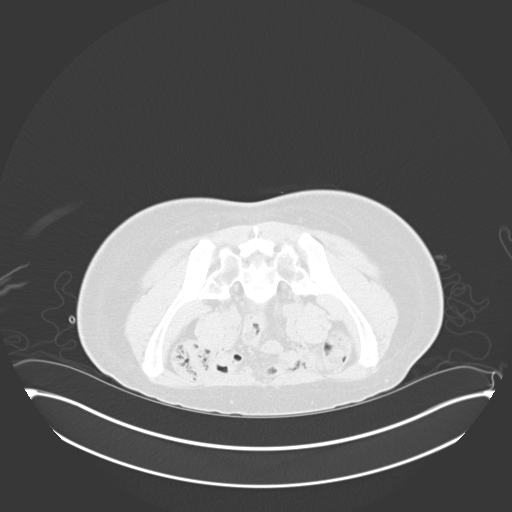
[im 7/19  lung]
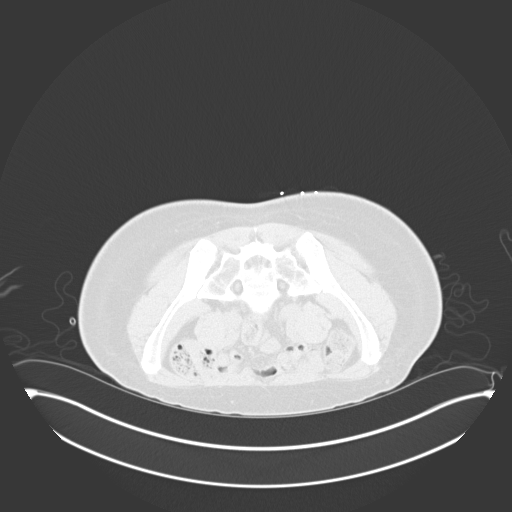
[im 8/19  lung]
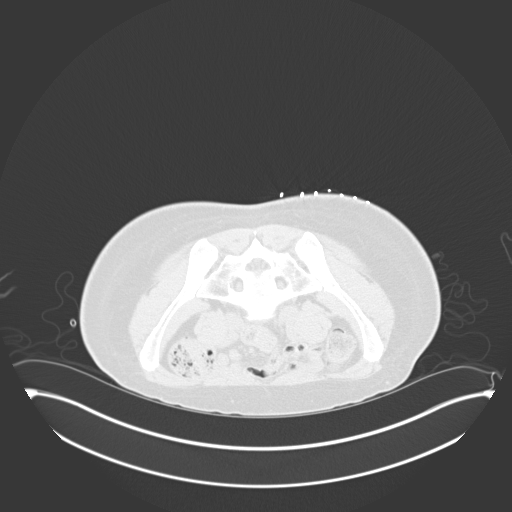
[im 11/19  lung]
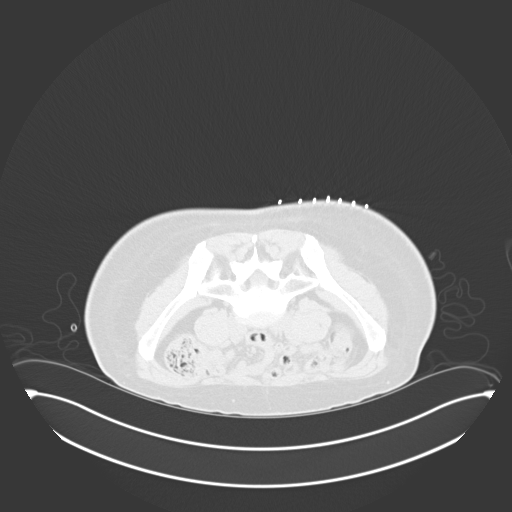
[im 12/19  mediastinal]
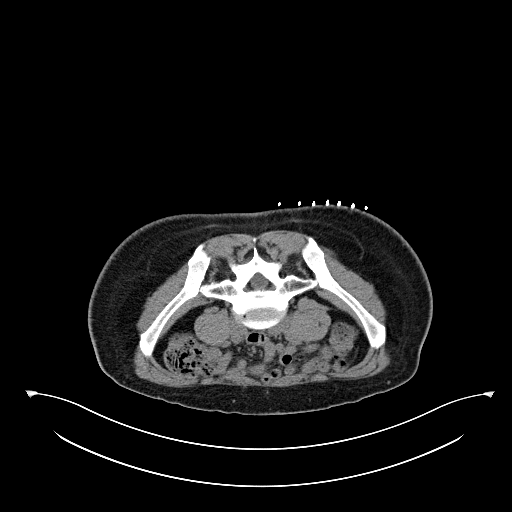
[im 12/19  lung]
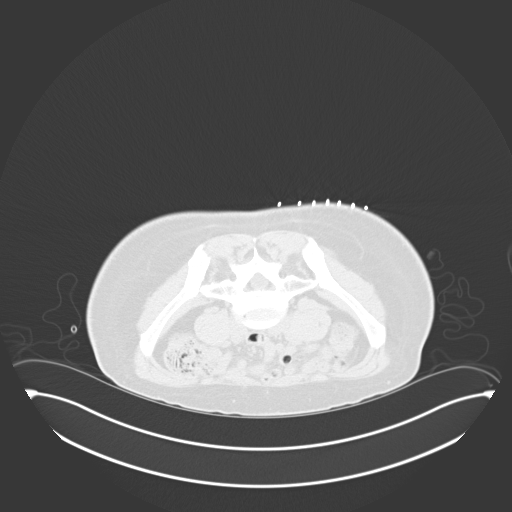
[im 13/19  lung]
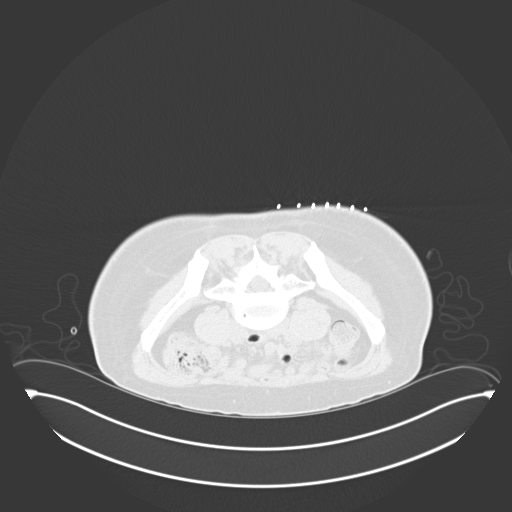
[im 14/19  lung]
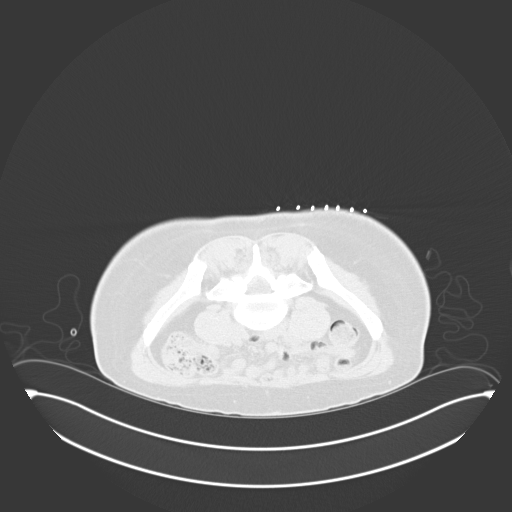
[im 15/19  lung]
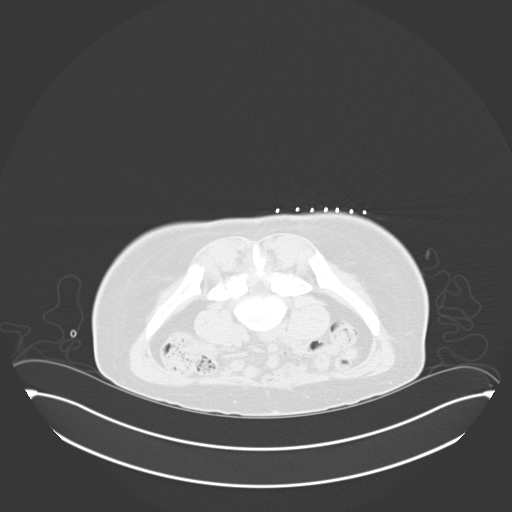
[im 16/19  mediastinal]
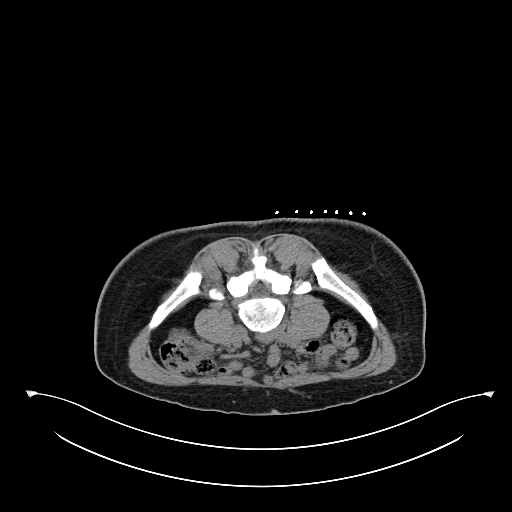
[im 16/19  lung]
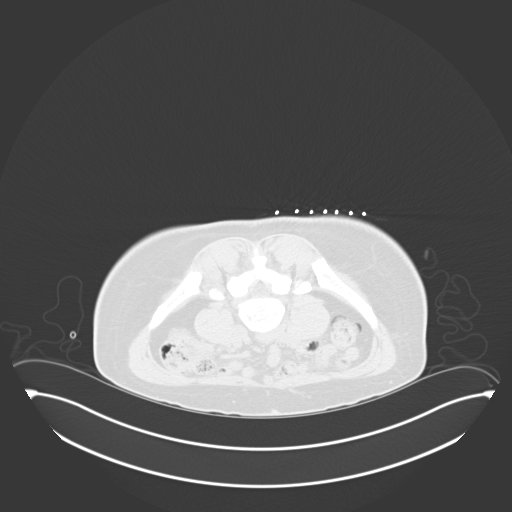
[im 17/19  lung]
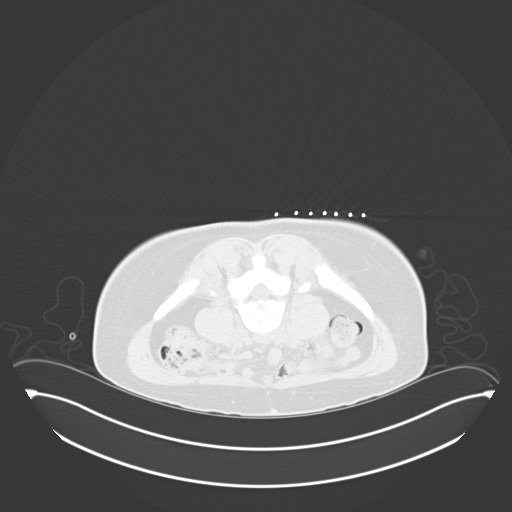

[14 of 29 positions shown; findings below may reference images not displayed]

EXAM:
CT-GUIDED BONE MARROW BIOPSY AND ASPIRATION

MEDICATIONS:
None

ANESTHESIA/SEDATION:
Moderate (conscious) sedation was employed during this procedure as
administered by the [REDACTED].

A total of Versed 1 mg and Fentanyl 50 mcg was administered
intravenously.

Moderate Sedation Time: 11 minutes. The patient's level of
consciousness and vital signs were monitored continuously by
radiology nursing throughout the procedure under my direct
supervision.

COMPLICATIONS:
None immediate.

PROCEDURE:
Informed consent was obtained from the patient following an
explanation of the procedure, risks, benefits and alternatives. The
patient understands, agrees and consents for the procedure. All
questions were addressed. A time out was performed prior to the
initiation of the procedure.

The patient was positioned prone and non-contrast localization CT
was performed of the pelvis to demonstrate the iliac marrow spaces.
The operative site was prepped and draped in the usual sterile
fashion.

Under sterile conditions and local anesthesia, a 22 gauge spinal
needle was utilized for procedural planning. Next, an 11 gauge
coaxial bone biopsy needle was advanced into the left iliac marrow
space. Needle position was confirmed with CT imaging. Initially, a
bone marrow aspiration was performed. Next, a bone marrow biopsy was
obtained with the 11 gauge outer bone marrow device. The 11 gauge
coaxial bone biopsy needle was re-advanced into a slightly different
location within the left iliac marrow space, positioning was
confirmed with CT imaging and an additional bone marrow biopsy was
obtained. Samples were prepared with the cytotechnologist and deemed
adequate. The needle was removed and superficial hemostasis was
obtained with manual compression. A dressing was applied. The
patient tolerated the procedure well without immediate post
procedural complication.
IMPRESSION: Successful CT guided left iliac bone marrow aspiration and core
biopsy.

## 2023-02-25 IMAGING — CT CT BIOPSY AND ASPIRATION BONE MARROW
1 of 3 series · 14 of 29 positions shown, 18 images · non-contrast
Comparison: none

INDICATION: Pancytopenia. Please perform CT-guided bone marrow biopsy for tissue
diagnostic purposes.

[Series 2: i-spiral 5.0 br40 · axial · 0.92mm/px · z∈[-90,-37]mm · 14 of 19 slices shown, 18 images]
[im 2/19  mediastinal]
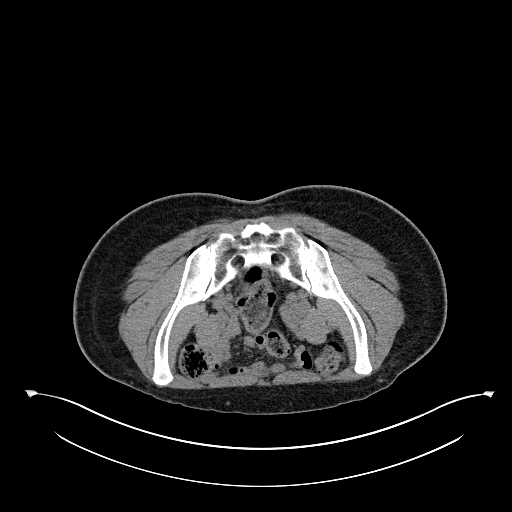
[im 2/19  lung]
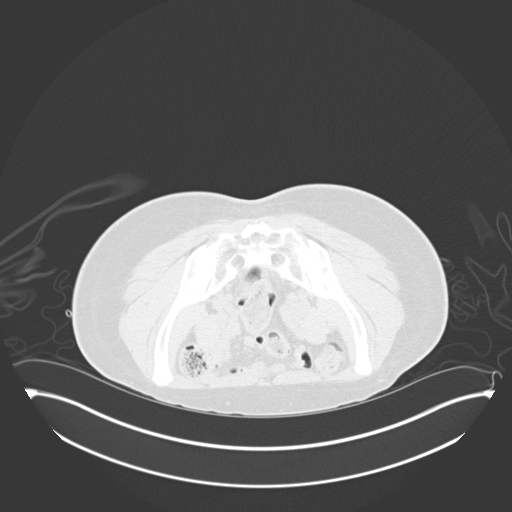
[im 3/19  lung]
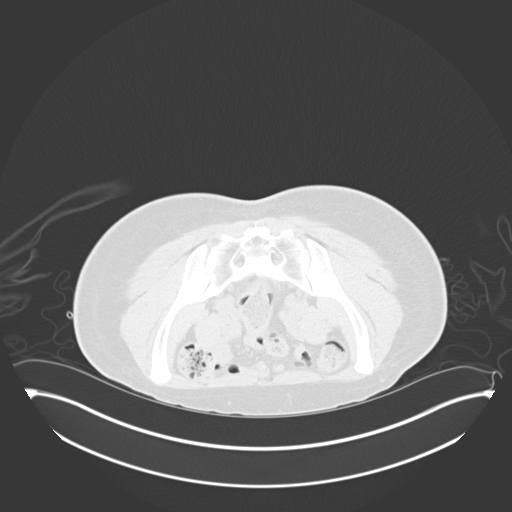
[im 4/19  lung]
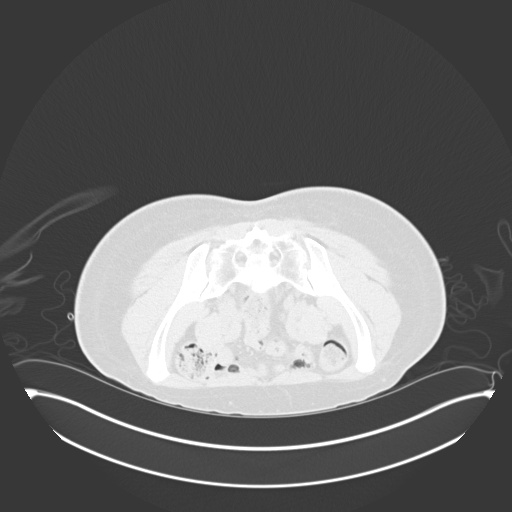
[im 5/19  lung]
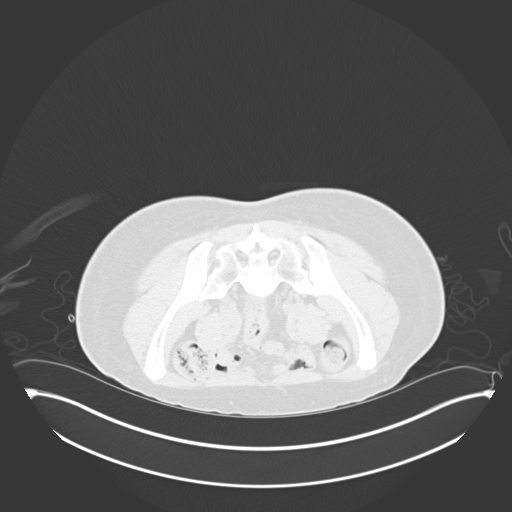
[im 6/19  mediastinal]
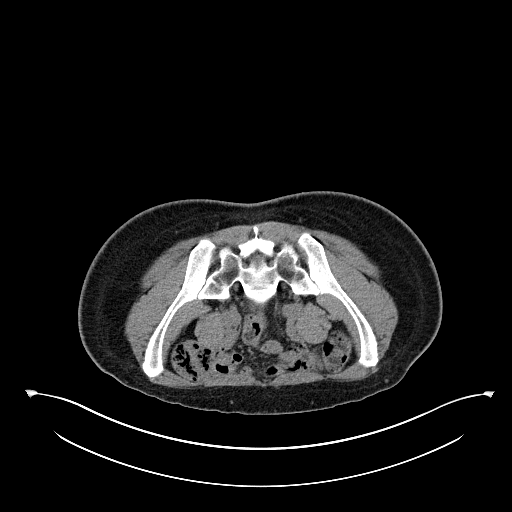
[im 6/19  lung]
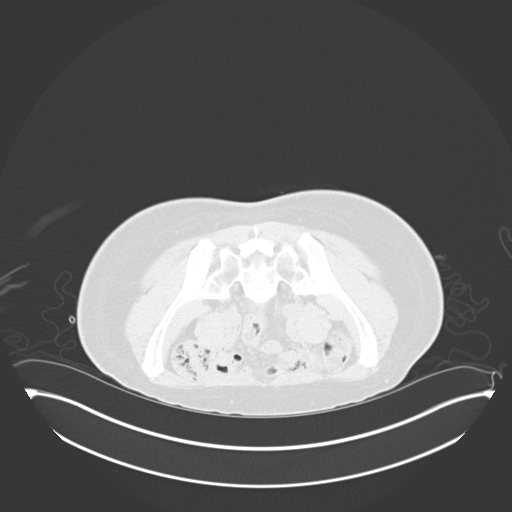
[im 7/19  lung]
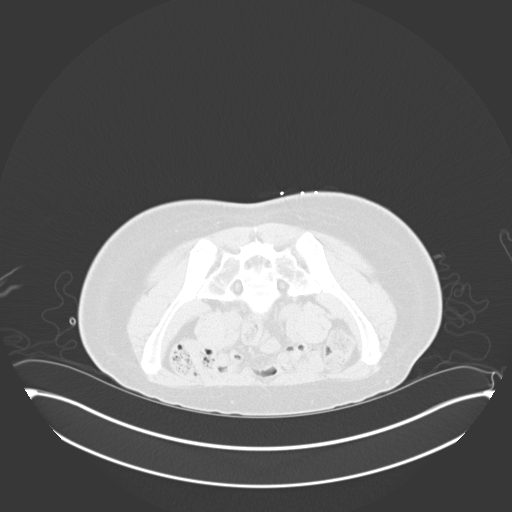
[im 8/19  lung]
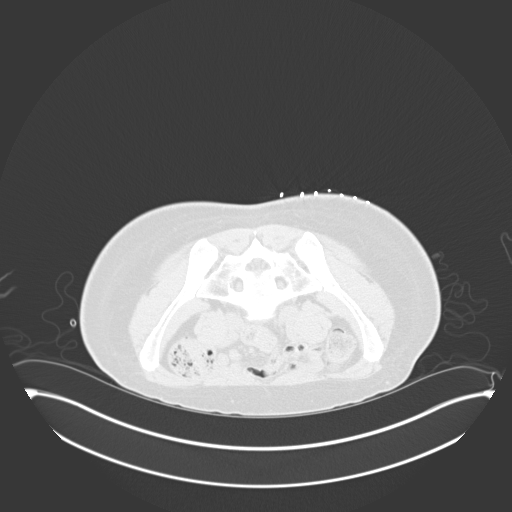
[im 11/19  lung]
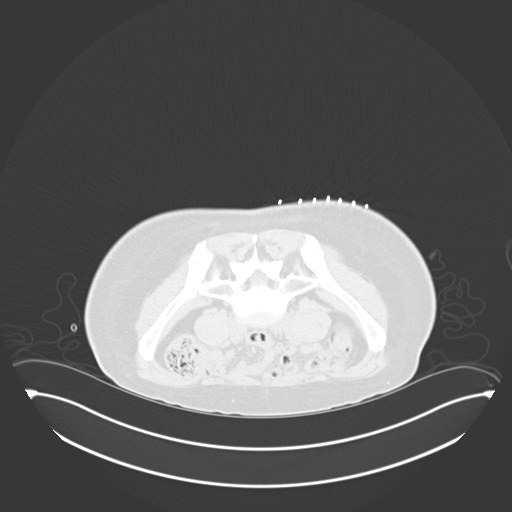
[im 12/19  mediastinal]
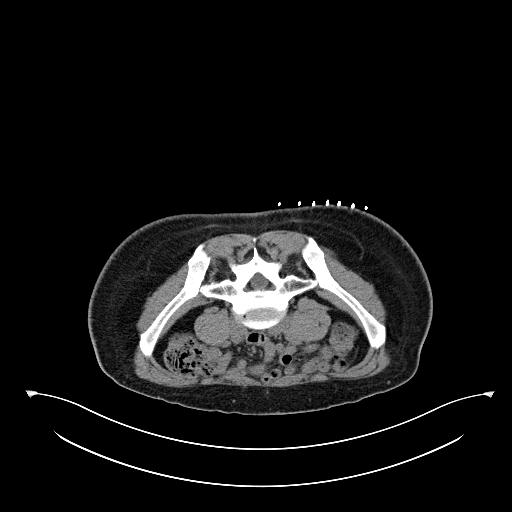
[im 12/19  lung]
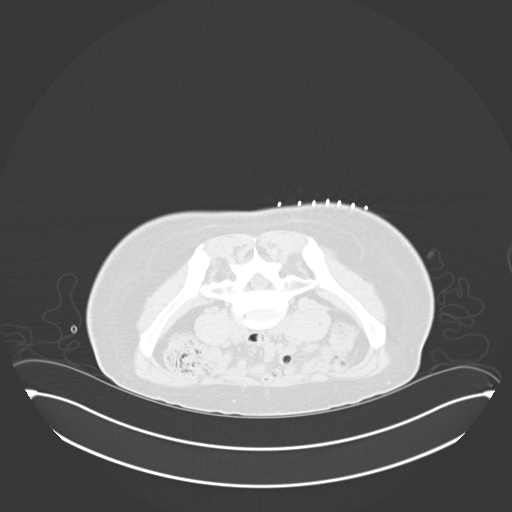
[im 13/19  lung]
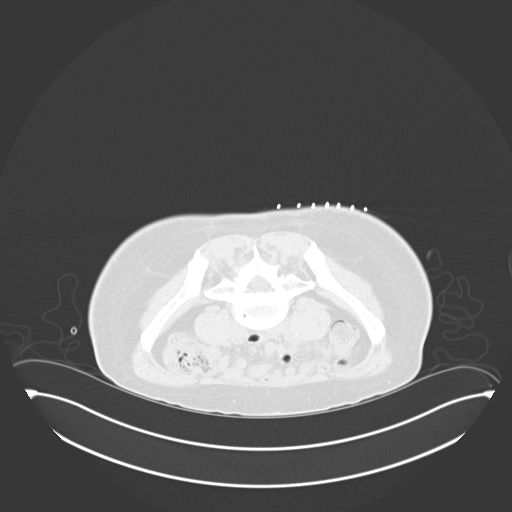
[im 14/19  lung]
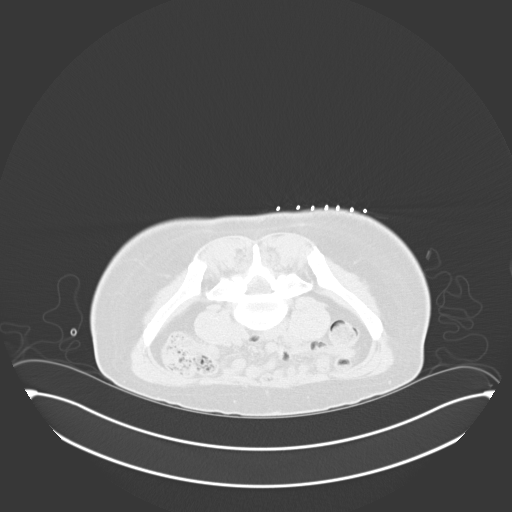
[im 15/19  lung]
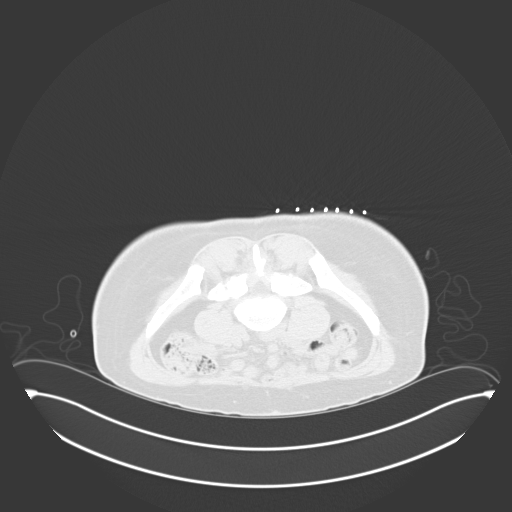
[im 16/19  mediastinal]
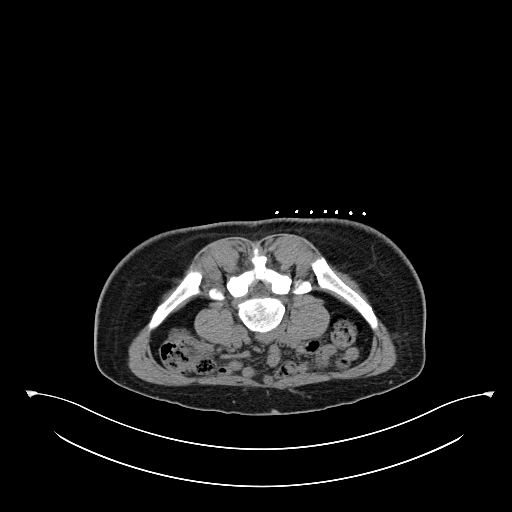
[im 16/19  lung]
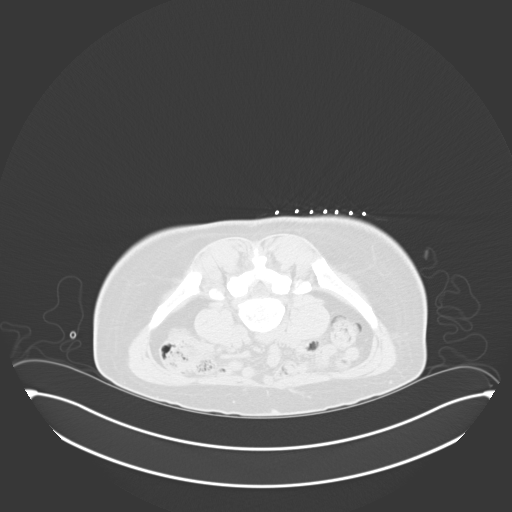
[im 17/19  lung]
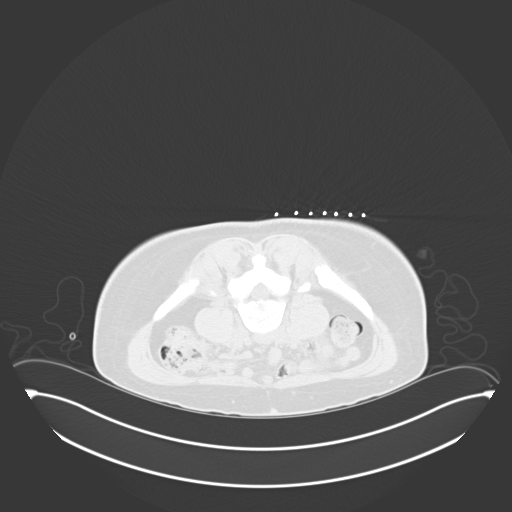

[14 of 29 positions shown; findings below may reference images not displayed]

EXAM:
CT-GUIDED BONE MARROW BIOPSY AND ASPIRATION

MEDICATIONS:
None

ANESTHESIA/SEDATION:
Moderate (conscious) sedation was employed during this procedure as
administered by the [REDACTED].

A total of Versed 1 mg and Fentanyl 50 mcg was administered
intravenously.

Moderate Sedation Time: 11 minutes. The patient's level of
consciousness and vital signs were monitored continuously by
radiology nursing throughout the procedure under my direct
supervision.

COMPLICATIONS:
None immediate.

PROCEDURE:
Informed consent was obtained from the patient following an
explanation of the procedure, risks, benefits and alternatives. The
patient understands, agrees and consents for the procedure. All
questions were addressed. A time out was performed prior to the
initiation of the procedure.

The patient was positioned prone and non-contrast localization CT
was performed of the pelvis to demonstrate the iliac marrow spaces.
The operative site was prepped and draped in the usual sterile
fashion.

Under sterile conditions and local anesthesia, a 22 gauge spinal
needle was utilized for procedural planning. Next, an 11 gauge
coaxial bone biopsy needle was advanced into the left iliac marrow
space. Needle position was confirmed with CT imaging. Initially, a
bone marrow aspiration was performed. Next, a bone marrow biopsy was
obtained with the 11 gauge outer bone marrow device. The 11 gauge
coaxial bone biopsy needle was re-advanced into a slightly different
location within the left iliac marrow space, positioning was
confirmed with CT imaging and an additional bone marrow biopsy was
obtained. Samples were prepared with the cytotechnologist and deemed
adequate. The needle was removed and superficial hemostasis was
obtained with manual compression. A dressing was applied. The
patient tolerated the procedure well without immediate post
procedural complication.
IMPRESSION: Successful CT guided left iliac bone marrow aspiration and core
biopsy.

## 2023-03-01 ENCOUNTER — Ambulatory Visit (HOSPITAL_COMMUNITY): Payer: Self-pay

## 2023-04-06 ENCOUNTER — Encounter (INDEPENDENT_AMBULATORY_CARE_PROVIDER_SITE_OTHER): Payer: Self-pay

## 2023-04-21 ENCOUNTER — Encounter: Payer: Self-pay | Admitting: Internal Medicine

## 2023-04-21 ENCOUNTER — Ambulatory Visit: Payer: 59 | Admitting: Internal Medicine

## 2023-04-21 VITALS — BP 134/82 | HR 76 | Temp 98.4°F | Ht 65.0 in | Wt 149.0 lb

## 2023-04-21 DIAGNOSIS — D61818 Other pancytopenia: Secondary | ICD-10-CM

## 2023-04-21 DIAGNOSIS — Z0000 Encounter for general adult medical examination without abnormal findings: Secondary | ICD-10-CM | POA: Diagnosis not present

## 2023-04-21 DIAGNOSIS — Z23 Encounter for immunization: Secondary | ICD-10-CM | POA: Diagnosis not present

## 2023-04-21 DIAGNOSIS — Z0001 Encounter for general adult medical examination with abnormal findings: Secondary | ICD-10-CM

## 2023-04-21 DIAGNOSIS — Z1231 Encounter for screening mammogram for malignant neoplasm of breast: Secondary | ICD-10-CM

## 2023-04-21 LAB — CBC WITH DIFFERENTIAL/PLATELET
Basophils Absolute: 0 10*3/uL (ref 0.0–0.1)
Basophils Relative: 0.7 % (ref 0.0–3.0)
Eosinophils Absolute: 0.2 10*3/uL (ref 0.0–0.7)
Eosinophils Relative: 7.4 % — ABNORMAL HIGH (ref 0.0–5.0)
HCT: 34.3 % — ABNORMAL LOW (ref 36.0–46.0)
Hemoglobin: 11 g/dL — ABNORMAL LOW (ref 12.0–15.0)
Lymphocytes Relative: 49.5 % — ABNORMAL HIGH (ref 12.0–46.0)
Lymphs Abs: 1.4 10*3/uL (ref 0.7–4.0)
MCHC: 32.1 g/dL (ref 30.0–36.0)
MCV: 82 fl (ref 78.0–100.0)
Monocytes Absolute: 0.3 10*3/uL (ref 0.1–1.0)
Monocytes Relative: 11.9 % (ref 3.0–12.0)
Neutro Abs: 0.9 10*3/uL — ABNORMAL LOW (ref 1.4–7.7)
Neutrophils Relative %: 30.5 % — ABNORMAL LOW (ref 43.0–77.0)
Platelets: 124 10*3/uL — ABNORMAL LOW (ref 150.0–400.0)
RBC: 4.18 Mil/uL (ref 3.87–5.11)
RDW: 13.2 % (ref 11.5–15.5)
WBC: 2.9 10*3/uL — ABNORMAL LOW (ref 4.0–10.5)

## 2023-04-21 LAB — HEPATIC FUNCTION PANEL
ALT: 18 U/L (ref 0–35)
AST: 21 U/L (ref 0–37)
Albumin: 4.4 g/dL (ref 3.5–5.2)
Alkaline Phosphatase: 43 U/L (ref 39–117)
Bilirubin, Direct: 0.1 mg/dL (ref 0.0–0.3)
Total Bilirubin: 0.3 mg/dL (ref 0.2–1.2)
Total Protein: 7.2 g/dL (ref 6.0–8.3)

## 2023-04-21 LAB — BASIC METABOLIC PANEL
BUN: 16 mg/dL (ref 6–23)
CO2: 30 mEq/L (ref 19–32)
Calcium: 9.6 mg/dL (ref 8.4–10.5)
Chloride: 106 mEq/L (ref 96–112)
Creatinine, Ser: 0.84 mg/dL (ref 0.40–1.20)
GFR: 79.56 mL/min (ref >60.00)
Glucose, Bld: 95 mg/dL (ref 70–99)
Potassium: 3.7 mEq/L (ref 3.5–5.1)
Sodium: 140 mEq/L (ref 135–145)

## 2023-04-21 LAB — VITAMIN B12: Vitamin B-12: 330 pg/mL (ref 211–911)

## 2023-04-21 LAB — TSH: TSH: 2.21 u[IU]/mL (ref 0.35–5.50)

## 2023-04-21 LAB — FOLATE: Folate: >24.2 ng/mL (ref >5.9)

## 2023-04-21 NOTE — Patient Instructions (Signed)
PLEASE COME BACK IN 6 MONTHS FOR YOUR SECOND SHINGLES VACCINE  Health Maintenance, Female Adopting a healthy lifestyle and getting preventive care are important in promoting health and wellness. Ask your health care provider about: The right schedule for you to have regular tests and exams. Things you can do on your own to prevent diseases and keep yourself healthy. What should I know about diet, weight, and exercise? Eat a healthy diet  Eat a diet that includes plenty of vegetables, fruits, low-fat dairy products, and lean protein. Do not eat a lot of foods that are high in solid fats, added sugars, or sodium. Maintain a healthy weight Body mass index (BMI) is used to identify weight problems. It estimates body fat based on height and weight. Your health care provider can help determine your BMI and help you achieve or maintain a healthy weight. Get regular exercise Get regular exercise. This is one of the most important things you can do for your health. Most adults should: Exercise for at least 150 minutes each week. The exercise should increase your heart rate and make you sweat (moderate-intensity exercise). Do strengthening exercises at least twice a week. This is in addition to the moderate-intensity exercise. Spend less time sitting. Even light physical activity can be beneficial. Watch cholesterol and blood lipids Have your blood tested for lipids and cholesterol at 53 years of age, then have this test every 5 years. Have your cholesterol levels checked more often if: Your lipid or cholesterol levels are high. You are older than 53 years of age. You are at high risk for heart disease. What should I know about cancer screening? Depending on your health history and family history, you may need to have cancer screening at various ages. This may include screening for: Breast cancer. Cervical cancer. Colorectal cancer. Skin cancer. Lung cancer. What should I know about heart  disease, diabetes, and high blood pressure? Blood pressure and heart disease High blood pressure causes heart disease and increases the risk of stroke. This is more likely to develop in people who have high blood pressure readings or are overweight. Have your blood pressure checked: Every 3-5 years if you are 29-52 years of age. Every year if you are 19 years old or older. Diabetes Have regular diabetes screenings. This checks your fasting blood sugar level. Have the screening done: Once every three years after age 101 if you are at a normal weight and have a low risk for diabetes. More often and at a younger age if you are overweight or have a high risk for diabetes. What should I know about preventing infection? Hepatitis B If you have a higher risk for hepatitis B, you should be screened for this virus. Talk with your health care provider to find out if you are at risk for hepatitis B infection. Hepatitis C Testing is recommended for: Everyone born from [birth year] through 1966. Anyone with known risk factors for hepatitis C. Sexually transmitted infections (STIs) Get screened for STIs, including gonorrhea and chlamydia, if: You are sexually active and are younger than 53 years of age. You are older than 53 years of age and your health care provider tells you that you are at risk for this type of infection. Your sexual activity has changed since you were last screened, and you are at increased risk for chlamydia or gonorrhea. Ask your health care provider if you are at risk. Ask your health care provider about whether you are at high risk for HIV. Your  health care provider may recommend a prescription medicine to help prevent HIV infection. If you choose to take medicine to prevent HIV, you should first get tested for HIV. You should then be tested every 3 months for as long as you are taking the medicine. Pregnancy If you are about to stop having your period (premenopausal) and you may become  pregnant, seek counseling before you get pregnant. Take 400 to 800 micrograms (mcg) of folic acid every day if you become pregnant. Ask for birth control (contraception) if you want to prevent pregnancy. Osteoporosis and menopause Osteoporosis is a disease in which the bones lose minerals and strength with aging. This can result in bone fractures. If you are 57 years old or older, or if you are at risk for osteoporosis and fractures, ask your health care provider if you should: Be screened for bone loss. Take a calcium or vitamin D supplement to lower your risk of fractures. Be given hormone replacement therapy (HRT) to treat symptoms of menopause. Follow these instructions at home: Alcohol use Do not drink alcohol if: Your health care provider tells you not to drink. You are pregnant, may be pregnant, or are planning to become pregnant. If you drink alcohol: Limit how much you have to: 0-1 drink a day. Know how much alcohol is in your drink. In the [place] one drink equals one 12 oz bottle of beer (355 mL), one 5 oz glass of wine (148 mL), or one 1 oz glass of hard liquor (44 mL). Lifestyle Do not use any products that contain nicotine or tobacco. These products include cigarettes, chewing tobacco, and vaping devices, such as e-cigarettes. If you need help quitting, ask your health care provider. Do not use street drugs. Do not share needles. Ask your health care provider for help if you need support or information about quitting drugs. General instructions Schedule regular health, dental, and eye exams. Stay current with your vaccines. Tell your health care provider if: You often feel depressed. You have ever been abused or do not feel safe at home. Summary Adopting a healthy lifestyle and getting preventive care are important in promoting health and wellness. Follow your health care provider's instructions about healthy diet, exercising, and getting tested or screened for  diseases. Follow your health care provider's instructions on monitoring your cholesterol and blood pressure. This information is not intended to replace advice given to you by your health care provider. Make sure you discuss any questions you have with your health care provider. Document Revised: 01/12/2021 Document Reviewed: 01/12/2021 Elsevier Patient Education  2024 [employer].

## 2023-04-21 NOTE — Progress Notes (Signed)
Subjective:  Patient ID: Carolyn Henry, female    DOB: [birth year]  Age: 52 y.o. MRN: [id number]  CC: Annual Exam and Anemia   HPI Clinton Sawyer Gelles presents for a CPX and f/up -----  Discussed the use of AI scribe software for clinical note transcription with the patient, who gave verbal consent to proceed.  History of Present Illness   The patient, with a known history of pancytopenia, presents for a routine physical examination. She reports no symptoms related to her bone marrow condition, such as sore throat, fevers, chills, night sweats, bleeding, bruising, numbness, or tingling. She denies any chest pain or shortness of breath during her daily activities, which include work and taking care of her son.  The patient's blood pressure is stable at 128/66. She reports no recent changes in weight or appetite, with a slight weight gain of one pound. She denies any gastrointestinal symptoms, such as blood in stool or abdominal pain. She also denies any swelling in her legs or feet.  The patient is due for a mammogram and expresses a desire to receive all due vaccines, including tetanus and shingles. She expresses no concerns about potential side effects from the vaccines, even in the context of her work environment with potential COVID exposure.  The patient is post-menopausal and reports no issues related to her menstrual cycles. She is active and does not report any symptoms that limit her daily activities. She expresses no concerns about her health and reports feeling well overall.       Outpatient Medications Prior to Visit  Medication Sig Dispense Refill   diclofenac Sodium (VOLTAREN) 1 % GEL Apply 2 g topically 4 (four) times daily. 150 g 0   ibuprofen (ADVIL) 600 MG tablet Take 1 tablet (600 mg total) by mouth every 8 (eight) hours as needed. 30 tablet 0   multivitamin-iron-minerals-folic acid (CENTRUM) chewable tablet Chew 1 tablet by mouth daily.     VITAMIN D,  ERGOCALCIFEROL, PO Take 500 mg PE by mouth daily.     No facility-administered medications prior to visit.    ROS Review of Systems  Constitutional: Negative.  Negative for appetite change, fatigue and fever.  HENT: Negative.    Eyes: Negative.   Respiratory:  Negative for cough, chest tightness, shortness of breath and wheezing.   Cardiovascular:  Negative for chest pain, palpitations and leg swelling.  Gastrointestinal:  Negative for abdominal pain, constipation, diarrhea, nausea and vomiting.  Endocrine: Negative.   Genitourinary: Negative.  Negative for difficulty urinating.  Musculoskeletal: Negative.  Negative for arthralgias, joint swelling and myalgias.  Skin: Negative.   Neurological: Negative.  Negative for dizziness and weakness.  Hematological:  Negative for adenopathy. Does not bruise/bleed easily.  Psychiatric/Behavioral: Negative.      Objective:  BP 134/82 (BP Location: Left Arm, Patient Position: Sitting, Cuff Size: Large)   Pulse 76   Temp 98.4 F (36.9 C) (Oral)   Ht 5\' 5"  (1.651 m)   Wt 149 lb (67.6 kg)   LMP 02/09/2012 (Approximate)   SpO2 98%   BMI 24.79 kg/m   BP Readings from Last 3 Encounters:  04/21/23 134/82  06/02/22 120/68  01/25/22 126/72    Wt Readings from Last 3 Encounters:  04/21/23 149 lb (67.6 kg)  06/02/22 145 lb (65.8 kg)  01/25/22 150 lb (68 kg)    Physical Exam Vitals reviewed.  Constitutional:      Appearance: Normal appearance.  HENT:     Nose: Nose normal.  Mouth/Throat:     Mouth: Mucous membranes are moist.  Eyes:     General: No scleral icterus.    Conjunctiva/sclera: Conjunctivae normal.  Cardiovascular:     Rate and Rhythm: Normal rate and regular rhythm.     Heart sounds: No murmur heard. Pulmonary:     Effort: Pulmonary effort is normal.     Breath sounds: No stridor. No wheezing, rhonchi or rales.  Abdominal:     General: Abdomen is flat.     Palpations: There is no mass.     Tenderness: There is  no abdominal tenderness. There is no guarding.     Hernia: No hernia is present.  Musculoskeletal:        General: Normal range of motion.     Cervical back: Neck supple.     Right lower leg: No edema.     Left lower leg: No edema.  Lymphadenopathy:     Cervical: No cervical adenopathy.  Skin:    General: Skin is warm and dry.     Coloration: Skin is not pale.  Neurological:     General: No focal deficit present.     Mental Status: She is alert. Mental status is at baseline.  Psychiatric:        Mood and Affect: Mood normal.        Behavior: Behavior normal.     Lab Results  Component Value Date   WBC 2.9 (L) 04/21/2023   HGB 11.0 (L) 04/21/2023   HCT 34.3 (L) 04/21/2023   PLT 124.0 (L) 04/21/2023   GLUCOSE 95 04/21/2023   CHOL 240 (H) 09/23/2021   TRIG 53.0 09/23/2021   HDL 82.20 09/23/2021   LDLCALC 148 (H) 09/23/2021   ALT 18 04/21/2023   AST 21 04/21/2023   NA 140 04/21/2023   K 3.7 04/21/2023   CL 106 04/21/2023   CREATININE 0.84 04/21/2023   BUN 16 04/21/2023   CO2 30 04/21/2023   TSH 2.21 04/21/2023    MM 3D SCREEN BREAST BILATERAL  Result Date: 01/01/2022 CLINICAL DATA:  Screening. EXAM: DIGITAL SCREENING BILATERAL MAMMOGRAM WITH TOMOSYNTHESIS AND CAD TECHNIQUE: Bilateral screening digital craniocaudal and mediolateral oblique mammograms were obtained. Bilateral screening digital breast tomosynthesis was performed. The images were evaluated with computer-aided detection. COMPARISON:  Previous exam(s). ACR Breast Density Category b: There are scattered areas of fibroglandular density. FINDINGS: There are no findings suspicious for malignancy. IMPRESSION: No mammographic evidence of malignancy. A result letter of this screening mammogram will be mailed directly to the patient. RECOMMENDATION: Screening mammogram in one year. (Code:SM-B-01Y) BI-RADS CATEGORY  1: Negative. Electronically Signed   By: Harmon Pier M.D.   On: 01/01/2022 09:50    Assessment & Plan:    Pancytopenia (HCC)- This is stable. Will evaluate for secondary causes. -     TSH; Future -     Vitamin B1; Future -     Zinc; Future -     Reticulocytes; Future -     CBC with Differential/Platelet; Future -     Basic metabolic panel; Future -     Vitamin B12; Future -     Folate; Future -     Hepatic function panel; Future  Screening mammogram for breast cancer -     3D Screening Mammogram, Left and Right; Future  Encounter for general adult medical examination with abnormal findings- Exam completed, labs reviewed-statin is not indiacted, vaccines reviewed and updated, cancer screenings addressed, pt ed material was given.  Follow-up: Return in about 6 months (around 10/22/2023).  Sanda Linger, MD

## 2023-04-30 LAB — RETICULOCYTES
ABS Retic: 36810 {cells}/uL (ref 20000–80000)
Retic Ct Pct: 0.9 %

## 2023-04-30 LAB — ZINC: Zinc: 74 ug/dL (ref 60–130)

## 2023-04-30 LAB — VITAMIN B1: Vitamin B1 (Thiamine): 21 nmol/L (ref 8–30)

## 2023-05-05 ENCOUNTER — Ambulatory Visit
Admission: RE | Admit: 2023-05-05 | Discharge: 2023-05-05 | Disposition: A | Discharge status: Home / Self Care | Payer: 59 | Source: Ambulatory Visit | Attending: Internal Medicine | Admitting: Internal Medicine

## 2023-05-05 DIAGNOSIS — Z1231 Encounter for screening mammogram for malignant neoplasm of breast: Secondary | ICD-10-CM

## 2023-10-24 ENCOUNTER — Telehealth: Payer: Self-pay | Admitting: Internal Medicine

## 2023-10-24 ENCOUNTER — Ambulatory Visit: Payer: 59 | Admitting: Internal Medicine

## 2023-10-24 NOTE — Telephone Encounter (Signed)
 Copied from CRM [id number]. Topic: General - Other >> Oct 24, 2023  1:27 PM Irine Seal wrote: Reason for CRM: patient is wanting her immunization records emailed to  [email or web address]

## 2023-10-26 NOTE — Telephone Encounter (Signed)
 Patient has been made aware that she would have to come in the office and pick up her immunization records. She gave a verbal understanding.

## 2023-12-01 ENCOUNTER — Ambulatory Visit: Payer: 59 | Admitting: Internal Medicine

## 2023-12-01 VITALS — BP 130/72 | HR 82 | Temp 98.2°F | Resp 16 | Ht 65.0 in | Wt 147.6 lb

## 2023-12-01 DIAGNOSIS — Z23 Encounter for immunization: Secondary | ICD-10-CM

## 2023-12-01 DIAGNOSIS — Z1159 Encounter for screening for other viral diseases: Secondary | ICD-10-CM | POA: Diagnosis not present

## 2023-12-01 DIAGNOSIS — D61818 Other pancytopenia: Secondary | ICD-10-CM

## 2023-12-01 DIAGNOSIS — Z139 Encounter for screening, unspecified: Secondary | ICD-10-CM

## 2023-12-01 DIAGNOSIS — Z111 Encounter for screening for respiratory tuberculosis: Secondary | ICD-10-CM

## 2023-12-01 LAB — CBC WITH DIFFERENTIAL/PLATELET
Basophils Absolute: 0 10*3/uL (ref 0.0–0.1)
Basophils Relative: 0.6 % (ref 0.0–3.0)
Eosinophils Absolute: 0.1 10*3/uL (ref 0.0–0.7)
Eosinophils Relative: 4.1 % (ref 0.0–5.0)
HCT: 34.6 % — ABNORMAL LOW (ref 36.0–46.0)
Hemoglobin: 11.3 g/dL — ABNORMAL LOW (ref 12.0–15.0)
Lymphocytes Relative: 47.1 % — ABNORMAL HIGH (ref 12.0–46.0)
Lymphs Abs: 1.5 10*3/uL (ref 0.7–4.0)
MCHC: 32.8 g/dL (ref 30.0–36.0)
MCV: 82 fl (ref 78.0–100.0)
Monocytes Absolute: 0.3 10*3/uL (ref 0.1–1.0)
Monocytes Relative: 10.3 % (ref 3.0–12.0)
Neutro Abs: 1.2 10*3/uL — ABNORMAL LOW (ref 1.4–7.7)
Neutrophils Relative %: 37.9 % — ABNORMAL LOW (ref 43.0–77.0)
Platelets: 133 10*3/uL — ABNORMAL LOW (ref 150.0–400.0)
RBC: 4.22 Mil/uL (ref 3.87–5.11)
RDW: 13.9 % (ref 11.5–15.5)
WBC: 3.1 10*3/uL — ABNORMAL LOW (ref 4.0–10.5)

## 2023-12-01 NOTE — Patient Instructions (Signed)
 Pancytopenia Pancytopenia is a condition in which a person has an abnormally low number (deficiency) of the following blood cells: Red blood cells (RBCs). Having too few RBCs is called anemia. White blood cells (WBCs). Having too few WBCs is called leukopenia. Cells that help the blood clot (platelets). Having too few platelets is called thrombocytopenia. Cells that become blood cells (stem cells) are made in the soft tissue inside the bones (bone marrow). All blood cells have a limited lifespan. Blood cells are constantly replaced with new blood cells from the bone marrow. Pancytopenia can be caused by any condition or disease that: Destroys the ability of bone marrow to make blood cells. Causes bone marrow to make blood cells that cannot survive after they leave the bone marrow. What are the causes? There are many possible causes of this condition. In some cases, the cause is not known.  Common causes of this condition include: Megaloblastic anemia. This is a disease that causes bone marrow to make immature blood cells. Aplastic anemia, also called bone marrow failure. Aplastic anemia occurs when soft tissue inside of bones (bone marrow) stops making enough blood cells. Hypersplenism, which is an enlarged spleen. An enlarged spleen can trap blood cells and destroy them faster than they can be replaced. Diseases of the blood or bone marrow that are passed from parent to child (inherited). Cancers that affect bone marrow. Certain medicines, such as: Chemotherapy. Medicines that reduce the activity of the immune system (immunosuppressant medicines). Exposure to radiation. Severe infections. What increases the risk? You are more likely to develop this condition if: You are female. You have a family history of a blood or bone marrow disease. You have certain conditions, such as: Alcohol use disorder. HIV (human immunodeficiency virus) or AIDS (acquired immunodeficiency  syndrome). Cancer. Conditions in which the body's disease-fighting system attacks normal tissues (autoimmune diseases). What are the signs or symptoms? Symptoms of pancytopenia vary depending on the cause and may include the following: Symptoms of anemia Pale skin. Weakness. Shortness of breath. Dizziness. Symptoms of leukopenia Frequent infections. Fever. Night sweats. Tiredness (fatigue). Symptoms of thrombocytopenia Unusual bruising or bleeding. A rash that looks like pinpoint, purplish-red spots (petechiae) on the skin and mucous membranes. Other symptoms Bone pain. Weight loss. Headache. Feeling unusually cold. How is this diagnosed? This condition may be diagnosed based on: Your symptoms. Your medical history. A physical exam. Tests. These may include: Removal of a sample of bone marrow to be looked at under a microscope (biopsy). This is done by putting a needle into a bone to remove fluid and cells (aspiration). A complete blood count (CBC). This is a group of tests that measures characteristics of WBCs, RBCs, and platelets. A peripheral blood smear. This test examines your blood under a microscope to provide information about medicines and diseases that affect RBCs, WBCs, and platelets. Reticulocyte count. This is a test that measures the number of new or immature RBCs (reticulocytes) that are made by your bone marrow. Imaging studies of your spleen or liver, such as X-rays. A test to measure your vitamin B12 level. Tests for viruses. How is this treated? Treatment for this condition depends on the cause. Treatment may include: Immunosuppressant medicines. Antibiotic medicine. Vitamin B12. This may be given as a treatment for megaloblastic anemia. Bone marrow-stimulating medicines. These are medicines that help the bone marrow make blood cells. Receiving donated blood through an IV (blood transfusion). A bone marrow transplant. A procedure to remove your spleen  (splenectomy). This may be done  as a treatment for hypersplenism. Follow these instructions at home: Caring for your body     Wash your hands often with soap and water for at least 20 seconds. If soap and water are not available, use hand sanitizer. Brush your teeth twice a day, and floss at least once a day. It is recommended that you visit the dentist every 6 months. Stay up to date on your vaccinations, including a yearly flu shot. Ask your health care provider which vaccines you should get. These may include a pneumonia vaccine. Medicines Take over-the-counter and prescription medicines only as told by your health care provider. If you were prescribed an antibiotic medicine, take it as told by your health care provider. Do not stop taking the antibiotic even if you start to feel better. Lifestyle Do not take part in contact sports or dangerous activities. Ask your health care provider what activities are safe for you. During cold and flu season, avoid crowded places and avoid contact with people who are sick. Do not use any products that contain nicotine or tobacco. These products include cigarettes, chewing tobacco, and vaping devices, such as e-cigarettes. If you need help quitting, ask your health care provider. General instructions Work with your health care provider to manage your condition and learn about your condition. Follow food safety recommendations as told by your health care provider. Keep all follow-up visits. This is important. Contact a health care provider if: You have a fever. You bruise or bleed easily. You are dizzy. You feel unusually weak or tired. Get help right away if: You have bleeding that does not stop. You are wheezing. This means making high-pitched whistling sounds when you breathe, most often when you breathe out. You have shortness of breath. You have chest pain. These symptoms may represent a serious problem that is an emergency. Do not wait to see  if the symptoms will go away. Get medical help right away. Call your local emergency services (911 in the [place] Do not drive yourself to the hospital. Summary Pancytopenia is a condition in which a person has an abnormally low number of red blood cells, white blood cells, and platelets. There are many possible causes of this condition. Treatment for this condition depends on the cause. Work with your health care provider to manage your condition and learn about your condition. This information is not intended to replace advice given to you by your health care provider. Make sure you discuss any questions you have with your health care provider. Document Revised: 02/06/2021 Document Reviewed: 02/06/2021 Elsevier Patient Education  2024 [employer].

## 2023-12-01 NOTE — Progress Notes (Signed)
 Subjective:  Patient ID: Carolyn Henry, female    DOB: 07-Jul-1971  Age: 53 y.o. MRN: [id number]  CC: Anemia   HPI Carolyn Henry presents for f/up  -----  She feels well. Offers no complaints. Needs titers completed to enter school.  Outpatient Medications Prior to Visit  Medication Sig Dispense Refill   diclofenac Sodium (VOLTAREN) 1 % GEL Apply 2 g topically 4 (four) times daily. 150 g 0   ibuprofen (ADVIL) 600 MG tablet Take 1 tablet (600 mg total) by mouth every 8 (eight) hours as needed. 30 tablet 0   multivitamin-iron-minerals-folic acid (CENTRUM) chewable tablet Chew 1 tablet by mouth daily.     VITAMIN D, ERGOCALCIFEROL, PO Take 500 mg PE by mouth daily.     No facility-administered medications prior to visit.    ROS Review of Systems  Constitutional: Negative.  Negative for chills, diaphoresis and fatigue.  HENT: Negative.    Respiratory: Negative.  Negative for chest tightness, shortness of breath and wheezing.   Cardiovascular:  Negative for chest pain, palpitations and leg swelling.  Gastrointestinal: Negative.  Negative for abdominal pain, constipation, diarrhea, nausea and vomiting.  Genitourinary:  Negative for difficulty urinating.  Skin: Negative.   Neurological:  Negative for dizziness and weakness.  Hematological:  Negative for adenopathy. Does not bruise/bleed easily.  Psychiatric/Behavioral: Negative.      Objective:  BP 130/72 (BP Location: Left Arm, Patient Position: Sitting)   Pulse 82   Temp 98.2 F (36.8 C) (Temporal)   Resp 16   Ht 5\' 5"  (1.651 m)   Wt 147 lb 9.6 oz (67 kg)   LMP 02/09/2012 (Approximate)   SpO2 98%   BMI 24.56 kg/m   BP Readings from Last 3 Encounters:  12/01/23 130/72  04/21/23 134/82  06/02/22 120/68    Wt Readings from Last 3 Encounters:  12/01/23 147 lb 9.6 oz (67 kg)  04/21/23 149 lb (67.6 kg)  06/02/22 145 lb (65.8 kg)    Physical Exam Vitals reviewed.  Constitutional:       Appearance: Normal appearance.  HENT:     Nose: Nose normal.     Mouth/Throat:     Mouth: Mucous membranes are moist.  Eyes:     General: No scleral icterus.    Conjunctiva/sclera: Conjunctivae normal.  Cardiovascular:     Rate and Rhythm: Normal rate and regular rhythm.     Heart sounds: No murmur heard. Pulmonary:     Effort: Pulmonary effort is normal.     Breath sounds: No stridor. No wheezing, rhonchi or rales.  Abdominal:     General: Abdomen is flat.     Palpations: There is no mass.     Tenderness: There is no abdominal tenderness. There is no guarding.     Hernia: No hernia is present.  Musculoskeletal:        General: Normal range of motion.     Cervical back: Neck supple.     Right lower leg: No edema.     Left lower leg: No edema.  Lymphadenopathy:     Cervical: No cervical adenopathy.  Skin:    General: Skin is warm and dry.  Neurological:     General: No focal deficit present.     Mental Status: She is alert.  Psychiatric:        Mood and Affect: Mood normal.        Behavior: Behavior normal.     Lab Results  Component Value Date  WBC 3.1 (L) 12/01/2023   HGB 11.3 (L) 12/01/2023   HCT 34.6 (L) 12/01/2023   PLT 133.0 (L) 12/01/2023   GLUCOSE 95 04/21/2023   CHOL 240 (H) 09/23/2021   TRIG 53.0 09/23/2021   HDL 82.20 09/23/2021   LDLCALC 148 (H) 09/23/2021   ALT 18 04/21/2023   AST 21 04/21/2023   NA 140 04/21/2023   K 3.7 04/21/2023   CL 106 04/21/2023   CREATININE 0.84 04/21/2023   BUN 16 04/21/2023   CO2 30 04/21/2023   TSH 2.21 04/21/2023    MM 3D SCREENING MAMMOGRAM BILATERAL BREAST Result Date: 05/10/2023 CLINICAL DATA:  Screening. EXAM: DIGITAL SCREENING BILATERAL MAMMOGRAM WITH TOMOSYNTHESIS AND CAD TECHNIQUE: Bilateral screening digital craniocaudal and mediolateral oblique mammograms were obtained. Bilateral screening digital breast tomosynthesis was performed. The images were evaluated with computer-aided detection. COMPARISON:   Previous exam(s). ACR Breast Density Category b: There are scattered areas of fibroglandular density. FINDINGS: There are no findings suspicious for malignancy. IMPRESSION: No mammographic evidence of malignancy. A result letter of this screening mammogram will be mailed directly to the patient. RECOMMENDATION: Screening mammogram in one year. (Code:SM-B-01Y) BI-RADS CATEGORY  1: Negative. Electronically Signed   By: Norva Pavlov M.D.   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 -     CBC with Differential/Platelet; Future -     Reticulocytes; Future -     Rubeola antibody IgG; Future -     Rubella screen; Future -     Varicella zoster antibody, IgG; Future -     Hepatitis B surface antibody,quantitative; Future -     Mumps antibody, IgG; Future -     QuantiFERON-TB Gold Plus; Future  Immunization due -     Varicella-zoster vaccine IM -     Rubeola antibody IgG; Future  Screening for tuberculosis -     QuantiFERON-TB Gold Plus; Future  Screening examination for rubella -     Rubella screen; Future  Screening due -     Rubeola antibody IgG; Future -     Rubella screen; Future -     Varicella zoster antibody, IgG; Future -     Hepatitis B surface antibody,quantitative; Future -     Mumps antibody, IgG; Future -     QuantiFERON-TB Gold Plus; Future  Need for hepatitis B screening test -     Hepatitis B surface antibody,quantitative; Future     Follow-up: Return in about 6 months (around 06/02/2024).  Sanda Linger, MD

## 2023-12-04 LAB — QUANTIFERON-TB GOLD PLUS
Mitogen-NIL: 8.61 [IU]/mL
NIL: 0.04 [IU]/mL
QuantiFERON-TB Gold Plus: NEGATIVE
TB1-NIL: 0.15 [IU]/mL
TB2-NIL: 0.14 [IU]/mL

## 2023-12-04 LAB — VARICELLA ZOSTER ANTIBODY, IGG: Varicella IgG: 43.4 {s_co_ratio}

## 2023-12-04 LAB — MUMPS ANTIBODY, IGG: Mumps IgG: 117 [AU]/ml

## 2023-12-04 LAB — RUBELLA SCREEN: Rubella: 8.21 {index}

## 2023-12-04 LAB — RETICULOCYTES
ABS Retic: 40800 {cells}/uL (ref 20000–80000)
Retic Ct Pct: 1 %

## 2023-12-04 LAB — HEPATITIS B SURFACE ANTIBODY, QUANTITATIVE: Hep B S AB Quant (Post): 41 m[IU]/mL (ref >=10)

## 2023-12-04 LAB — RUBEOLA ANTIBODY IGG: Rubeola IgG: 224 [AU]/ml

## 2023-12-05 ENCOUNTER — Encounter: Payer: Self-pay | Admitting: Internal Medicine

## 2024-01-13 ENCOUNTER — Ambulatory Visit: Payer: Self-pay | Admitting: *Deleted

## 2024-01-13 NOTE — Telephone Encounter (Signed)
  Chief Complaint: right hand pain , right ring finger swelling Symptoms: right hand pain thumb and right ring finger swelling and unable to lift finger without assist moving  Frequency: 2 weeks  Pertinent Negatives: Patient denies fever no numbness  Disposition: [] ED /[] Urgent Care (no appt availability in office) / [x] Appointment(In office/virtual)/ []  [facility]/ [] Home Care/ [] Refused Recommended Disposition /[]  Mobile Bus/ []  Follow-up with PCP Additional Notes:   No available appt today as requested. Appt scheduled for 01/16/24. None available with PCP. Patient recommended any OTC medications that may help with pain other than tylenol. May send via My chart messages.      Copied from CRM [id number]. Topic: Clinical - Red Word Triage >> Jan 13, 2024 11:08 AM Orien Bird wrote: [facility] that prompted transfer to Nurse Triage: patient called stating her hand is hurting her real bad and it's been for 2 weeks and she is wanting to see her dr for it. Reason for Disposition  Weakness (i.e., loss of strength) of new-onset in hand or fingers  (Exceptions: not truly weak, hand feels weak because of pain; weakness present > 2 weeks)  Answer Assessment - Initial Assessment Questions 1. ONSET: "When did the pain start?"     2 weeks ago  2. LOCATION: "Where is the pain located?"     Right hand and right ring finger  3. PAIN: "How bad is the pain?" (Scale 1-10; or mild, moderate, severe)   - MILD (1-3): doesn't interfere with normal activities   - MODERATE (4-7): interferes with normal activities (e.g., work or school) or awakens from sleep   - SEVERE (8-10): excruciating pain, unable to use hand at all     Moderate  4. WORK OR EXERCISE: "Has there been any recent work or exercise that involved this part (i.e., hand or wrist) of the body?"     Na  5. CAUSE: "What do you think is causing the pain?"     Not sure  6. AGGRAVATING FACTORS: "What makes the pain worse?" (e.g., using  computer)     Na  7. OTHER SYMPTOMS: "Do you have any other symptoms?" (e.g., neck pain, swelling, rash, numbness, fever)     Swelling ring finger right side and thumb pain as well 8. PREGNANCY: "Is there any chance you are pregnant?" "When was your last menstrual period?"     na  Protocols used: Hand and Wrist Pain-A-AH

## 2024-01-15 NOTE — Progress Notes (Unsigned)
   Acute Office Visit  Subjective:     Patient ID: Carolyn Henry, female    DOB: May 05, 1971, 53 y.o.   MRN: [id number]  No chief complaint on file.   HPI Patient is in today for evaluation of right hand pain for the last 2 weeks.  Denies known injury. Has tried Reports Denies numbness, tingling, color change, other concerns today.  ROS Per HPI      Objective:    LMP 02/09/2012 (Approximate)    Physical Exam Vitals and nursing note reviewed.  Constitutional:      General: She is not in acute distress.    Appearance: Normal appearance. She is normal weight.  HENT:     Head: Normocephalic and atraumatic.     Right Ear: External ear normal.     Left Ear: External ear normal.     Nose: Nose normal.     Mouth/Throat:     Mouth: Mucous membranes are moist.     Pharynx: Oropharynx is clear.  Eyes:     Extraocular Movements: Extraocular movements intact.     Pupils: Pupils are equal, round, and reactive to light.  Cardiovascular:     Rate and Rhythm: Normal rate and regular rhythm.     Pulses: Normal pulses.     Heart sounds: Normal heart sounds.  Pulmonary:     Effort: Pulmonary effort is normal. No respiratory distress.     Breath sounds: Normal breath sounds. No wheezing, rhonchi or rales.  Musculoskeletal:        General: Normal range of motion.     Cervical back: Normal range of motion.     Right lower leg: No edema.     Left lower leg: No edema.  Lymphadenopathy:     Cervical: No cervical adenopathy.  Neurological:     General: No focal deficit present.     Mental Status: She is alert and oriented to person, place, and time.  Psychiatric:        Mood and Affect: Mood normal.        Thought Content: Thought content normal.     No results found for any visits on 01/16/24.      Assessment & Plan:   There are no diagnoses linked to this encounter.   No orders of the defined types were placed in this encounter.   No follow-ups on  file.  Wellington Half, FNP

## 2024-01-16 ENCOUNTER — Encounter: Payer: Self-pay | Admitting: Family Medicine

## 2024-01-16 ENCOUNTER — Other Ambulatory Visit (HOSPITAL_COMMUNITY): Payer: Self-pay

## 2024-01-16 ENCOUNTER — Ambulatory Visit: Admitting: Family Medicine

## 2024-01-16 VITALS — BP 120/80 | HR 87 | Temp 98.1°F | Ht 65.0 in | Wt 151.2 lb

## 2024-01-16 DIAGNOSIS — M79641 Pain in right hand: Secondary | ICD-10-CM

## 2024-01-16 MED ORDER — IBUPROFEN 800 MG PO TABS
800.0000 mg | ORAL_TABLET | Freq: Three times a day (TID) | ORAL | 0 refills | Status: AC | PRN
  Filled 2024-01-16: qty 30, 10d supply, fill #0

## 2024-01-16 NOTE — Patient Instructions (Signed)
 I have sent in ibuprofen  for you to take up to three times per day as needed with food.  We have placed a referral for you today.  Someone will be reaching out to get you scheduled.  Follow-up with me for new or worsening symptoms.

## 2024-01-21 ENCOUNTER — Other Ambulatory Visit (HOSPITAL_COMMUNITY): Payer: Self-pay

## 2024-01-25 NOTE — Progress Notes (Signed)
   Joanna Muck, PhD, LAT, ATC acting as a scribe for Garlan Juniper, MD.  [name] is a 53 y.o. female who presents to [facility] today for R hand pain x 3-4 wks. She is RHD. Pt locates pain to the 4th finger w/ triggering present.  She works as a Psychologist, sport and exercise at [employer].   Grip strength: WNL Aggravates: use Treatments tried: IBU, ice  Dx imaging: 06/02/22 R thumb & R wrist XR  Pertinent review of systems: No fevers or chills  Relevant historical information: Pancytopenia. 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   Exam:  BP 124/80   Pulse 60   Ht 5\' 5"  (1.651 m)   Wt 148 lb (67.1 kg)   LMP 02/09/2012 (Approximate)   SpO2 99%   BMI 24.63 kg/m  General: Well Developed, well nourished, and in no acute distress.   MSK: Right hand normal appearing Triggering present at fourth digit and first digit.  Tender palpation MCPs 1st and 4th digit.      Assessment and Plan: 53 y.o. female with right hand trigger finger at the thumb and fourth digit.  Margarette would like to try a little bit of conservative management first which is reasonable.  Plan for double Band-Aid splint oral ibuprofen  as prescribed by PCP and topical Voltaren  gel.  If not improving she can return and we will consider steroid injection.   PDMP not reviewed this encounter. Orders Placed This Encounter  Procedures   US  LIMITED JOINT SPACE STRUCTURES UP RIGHT(NO LINKED CHARGES)    Reason for Exam (SYMPTOM  OR DIAGNOSIS REQUIRED):   right hand pain    Preferred imaging location?:   [facility]   No orders of the defined types were placed in this encounter.    Discussed warning signs or symptoms. Please see discharge instructions. Patient expresses understanding.   The above documentation has been reviewed and is accurate and complete Garlan Juniper, M.D.

## 2024-01-26 ENCOUNTER — Ambulatory Visit (INDEPENDENT_AMBULATORY_CARE_PROVIDER_SITE_OTHER): Admitting: Family Medicine

## 2024-01-26 ENCOUNTER — Other Ambulatory Visit: Payer: Self-pay

## 2024-01-26 ENCOUNTER — Ambulatory Visit: Admitting: Family Medicine

## 2024-01-26 VITALS — BP 124/80 | HR 60 | Ht 65.0 in | Wt 148.0 lb

## 2024-01-26 DIAGNOSIS — M79641 Pain in right hand: Secondary | ICD-10-CM | POA: Diagnosis not present

## 2024-01-26 DIAGNOSIS — M6530 Trigger finger, unspecified finger: Secondary | ICD-10-CM | POA: Diagnosis not present

## 2024-01-26 NOTE — Patient Instructions (Signed)
 Thank you for coming in today.  Use a Double Band-aid Splint.  Please use Voltaren  gel (Generic Diclofenac  Gel) up to 4x daily for pain as needed.  This is available over-the-counter as both the name brand Voltaren  gel and the generic diclofenac  gel.   If not improving, schedule a visit to try an injection

## 2024-02-22 ENCOUNTER — Telehealth: Payer: Self-pay | Admitting: Internal Medicine

## 2024-02-22 NOTE — Telephone Encounter (Signed)
 Patient dropped off document medical information form, to be filled out by provider. Patient requested to send it back via Call Patient to pick up within 7-days. Document is located in providers tray at front office.Please advise at Mobile [phone] (mobile)

## 2024-02-23 NOTE — Telephone Encounter (Signed)
 Pt has picked up form

## 2024-03-06 DIAGNOSIS — H524 Presbyopia: Secondary | ICD-10-CM | POA: Diagnosis not present

## 2024-03-28 ENCOUNTER — Other Ambulatory Visit: Payer: Self-pay | Admitting: Internal Medicine

## 2024-03-28 DIAGNOSIS — Z1231 Encounter for screening mammogram for malignant neoplasm of breast: Secondary | ICD-10-CM

## 2024-05-10 ENCOUNTER — Ambulatory Visit
Admission: RE | Admit: 2024-05-10 | Discharge: 2024-05-10 | Disposition: A | Discharge status: Home / Self Care | Source: Ambulatory Visit | Attending: Internal Medicine | Admitting: Internal Medicine

## 2024-05-10 DIAGNOSIS — Z1231 Encounter for screening mammogram for malignant neoplasm of breast: Secondary | ICD-10-CM | POA: Diagnosis not present
